# Patient Record
Sex: Male | Born: 1960 | Race: Black or African American | Hispanic: No | Marital: Single | Smoking: Former smoker
Health system: Southern US, Community
[De-identification: ages and names within clinical notes are randomized; demographics above are authoritative.]

## PROBLEM LIST (undated history)

## (undated) DIAGNOSIS — E669 Obesity, unspecified: Secondary | ICD-10-CM

## (undated) DIAGNOSIS — K648 Other hemorrhoids: Secondary | ICD-10-CM

---

## 2010-12-10 ENCOUNTER — Ambulatory Visit
Admission: RE | Admit: 2010-12-10 | Discharge: 2010-12-10 | Disposition: A | Discharge status: Home / Self Care | Payer: Self-pay | Source: Ambulatory Visit | Attending: Internal Medicine | Admitting: Internal Medicine

## 2010-12-10 ENCOUNTER — Other Ambulatory Visit: Payer: Self-pay | Admitting: Internal Medicine

## 2010-12-10 DIAGNOSIS — M549 Dorsalgia, unspecified: Secondary | ICD-10-CM

## 2010-12-13 ENCOUNTER — Emergency Department (HOSPITAL_COMMUNITY): Payer: Medicaid Other

## 2010-12-13 ENCOUNTER — Emergency Department (HOSPITAL_COMMUNITY)
Admission: EM | Admit: 2010-12-13 | Discharge: 2010-12-13 | Disposition: A | Discharge status: Home / Self Care | Payer: Medicaid Other | Attending: Emergency Medicine | Admitting: Emergency Medicine

## 2010-12-13 DIAGNOSIS — R0789 Other chest pain: Secondary | ICD-10-CM

## 2010-12-13 DIAGNOSIS — R072 Precordial pain: Secondary | ICD-10-CM | POA: Insufficient documentation

## 2010-12-13 MED ORDER — HYDROCODONE-ACETAMINOPHEN 7.5-325 MG/15ML PO SOLN: 15.0000 mL | Freq: Four times a day (QID) | ORAL | Status: AC | PRN

## 2010-12-13 MED ORDER — MELOXICAM 7.5 MG PO TABS: 7.5000 mg | ORAL_TABLET | Freq: Every day | ORAL | Status: DC

## 2010-12-13 MED ORDER — TRAMADOL HCL 50 MG PO TABS: 50.0000 mg | ORAL_TABLET | Freq: Four times a day (QID) | ORAL | Status: AC | PRN

## 2010-12-13 MED ORDER — TRAMADOL HCL 50 MG PO TABS
100.0000 mg | ORAL_TABLET | Freq: Once | ORAL | Status: AC
  Administered 2010-12-13: 100 mg via ORAL
  Filled 2010-12-13 (×2): qty 1

## 2010-12-13 MED ORDER — MELOXICAM 15 MG PO TABS
15.0000 mg | ORAL_TABLET | Freq: Once | ORAL | Status: AC
  Administered 2010-12-13: 15 mg via ORAL
  Filled 2010-12-13: qty 1
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
## 2010-12-18 NOTE — ED Provider Notes (Signed)
Medical screening examination/treatment/procedure(s) were performed by non-physician practitioner and as supervising physician I was immediately available for consultation/collaboration.   Suzi Roots, MD 12/18/10 [phone]

## 2011-09-19 ENCOUNTER — Emergency Department (HOSPITAL_COMMUNITY)
Admission: EM | Admit: 2011-09-19 | Discharge: 2011-09-19 | Disposition: A | Discharge status: Home / Self Care | Payer: Medicaid Other | Source: Home / Self Care | Attending: Emergency Medicine | Admitting: Emergency Medicine

## 2011-09-19 ENCOUNTER — Encounter (HOSPITAL_COMMUNITY): Payer: Self-pay | Admitting: *Deleted

## 2011-09-19 DIAGNOSIS — K625 Hemorrhage of anus and rectum: Secondary | ICD-10-CM

## 2011-09-19 HISTORY — DX: Other hemorrhoids: K64.8

## 2011-09-19 HISTORY — DX: Obesity, unspecified: E66.9

## 2011-09-19 NOTE — ED Provider Notes (Signed)
History     CSN: [id number]  Arrival date & time 09/19/11  1000   First MD Initiated Contact with Patient 09/19/11 1016      Chief Complaint  Patient presents with  . Rectal Bleeding    (Consider location/radiation/quality/duration/timing/severity/associated sxs/prior treatment) HPI Comments: Patient with a long history of intermittent rectal bleeding reports painless rectal bleeding which varies from bright red blood per rectum to noting blood on the toilet paper every time time he has a bowel movement over the past week, which is usually once a day or every 2 days. No melena. No chest pain, shortness of breath, weakness, presyncope, syncope. he had a normal colonoscopy 6 months ago. No particular aggravating or alleviating factors. Hasn't tried anything for his symptoms. No history of Crohn's, ulcerative colitis, IBS, chronic polyps, diverticulitis, abdominal surgery. States that his diabetes is diet controlled.  ROS as noted in HPI. All other ROS negative.   Patient is a 51 y.o. male presenting with hematochezia. The history is provided by the patient. No language interpreter was used.  Rectal Bleeding  The current episode started more than 1 week ago. The onset was sudden. The problem occurs frequently. The problem has been unchanged. The patient is experiencing no pain. The stool is described as soft. There was no prior successful therapy. There was no prior unsuccessful therapy. Associated symptoms include hemorrhoids. Pertinent negatives include no anorexia, no fever, no abdominal pain, no diarrhea, no hematemesis, no nausea, no rectal pain, no vomiting, no hematuria, no chest pain and no difficulty breathing. His past medical history does not include abdominal surgery, inflammatory bowel disease, recent abdominal injury, recent antibiotic use, recent change in diet or a recent illness.    Past Medical History  Diagnosis Date  . Diabetes mellitus   . Internal hemorrhoids   .  Obesity     History reviewed. No pertinent past surgical history.  History reviewed. No pertinent family history.  History  Substance Use Topics  . Smoking status: Former Games developer  . Smokeless tobacco: Not on file  . Alcohol Use: No      Review of Systems  Constitutional: Negative for fever.  Cardiovascular: Negative for chest pain.  Gastrointestinal: Positive for hematochezia and hemorrhoids. Negative for nausea, vomiting, abdominal pain, diarrhea, rectal pain, anorexia and hematemesis.  Genitourinary: Negative for hematuria.    Allergies  Ibuprofen  Home Medications  No current outpatient prescriptions on file.  BP 141/81  Pulse 79  Temp 98 F (36.7 C) (Oral)  Resp 16  SpO2 96%  Physical Exam  Nursing note and vitals reviewed. Constitutional: He is oriented to person, place, and time. He appears well-developed and well-nourished.  HENT:  Head: Normocephalic and atraumatic.  Eyes: Conjunctivae and EOM are normal.  Neck: Normal range of motion.  Cardiovascular: Normal rate.   Pulmonary/Chest: Effort normal. No respiratory distress.  Abdominal: Normal appearance and bowel sounds are normal. He exhibits no distension, no ascites and no mass. There is no tenderness. There is no rigidity, no rebound, no guarding and no CVA tenderness.       Obese  Genitourinary: Prostate normal. Rectal exam shows no external hemorrhoid, no internal hemorrhoid, no fissure, no mass, no tenderness and anal tone normal. Guaiac positive stool.       Erythematous, friable rectal mucosa above the dentate line. No active bleeding.  Musculoskeletal: Normal range of motion.  Neurological: He is alert and oriented to person, place, and time. Coordination normal.  Skin: Skin is warm and  dry.  Psychiatric: He has a normal mood and affect. His behavior is normal. Judgment and thought content normal.    ED Course  Procedures (including critical care time)   Labs Reviewed  OCCULT BLOOD, POC  DEVICE  OCCULT BLOOD X 1 CARD TO LAB, STOOL   No results found.   1. Rectal bleeding    Results for orders placed during the hospital encounter of 09/19/11  OCCULT BLOOD, POC DEVICE      Component Value Range   Fecal Occult Bld POSITIVE       MDM  No active bleeding noted on anoscopy. Vitals are acceptable, patient not reporting any symptoms of anemia. Abdomen is benign. Given normal colonoscopy 6 months ago, doubt malignancy. Will have him followup with his GI specialist or with Dr. Arlyce Dice, GI on call next week, as Crohn's and ulcerative colitis still in differential. Discussed signs and symptoms that should prompt has returned to the emergency department. Patient agrees with plan.  Luiz Blare, MD 09/19/11 [phone]

## 2011-09-19 NOTE — ED Notes (Signed)
Pt reports he had a colonoscopy in March of the  Year and he was told it was norman.  Pt states he has had bright red rectal bleeding intermittently the last week.

## 2013-01-15 IMAGING — CR DG THORACIC SPINE 3V
4 series · 4 of 4 positions shown · non-contrast
Comparison: None.

CLINICAL DATA: Back pain.

THORACIC SPINE - 2 VIEW + SWIMMERS

[view not recorded (1 of 4)]
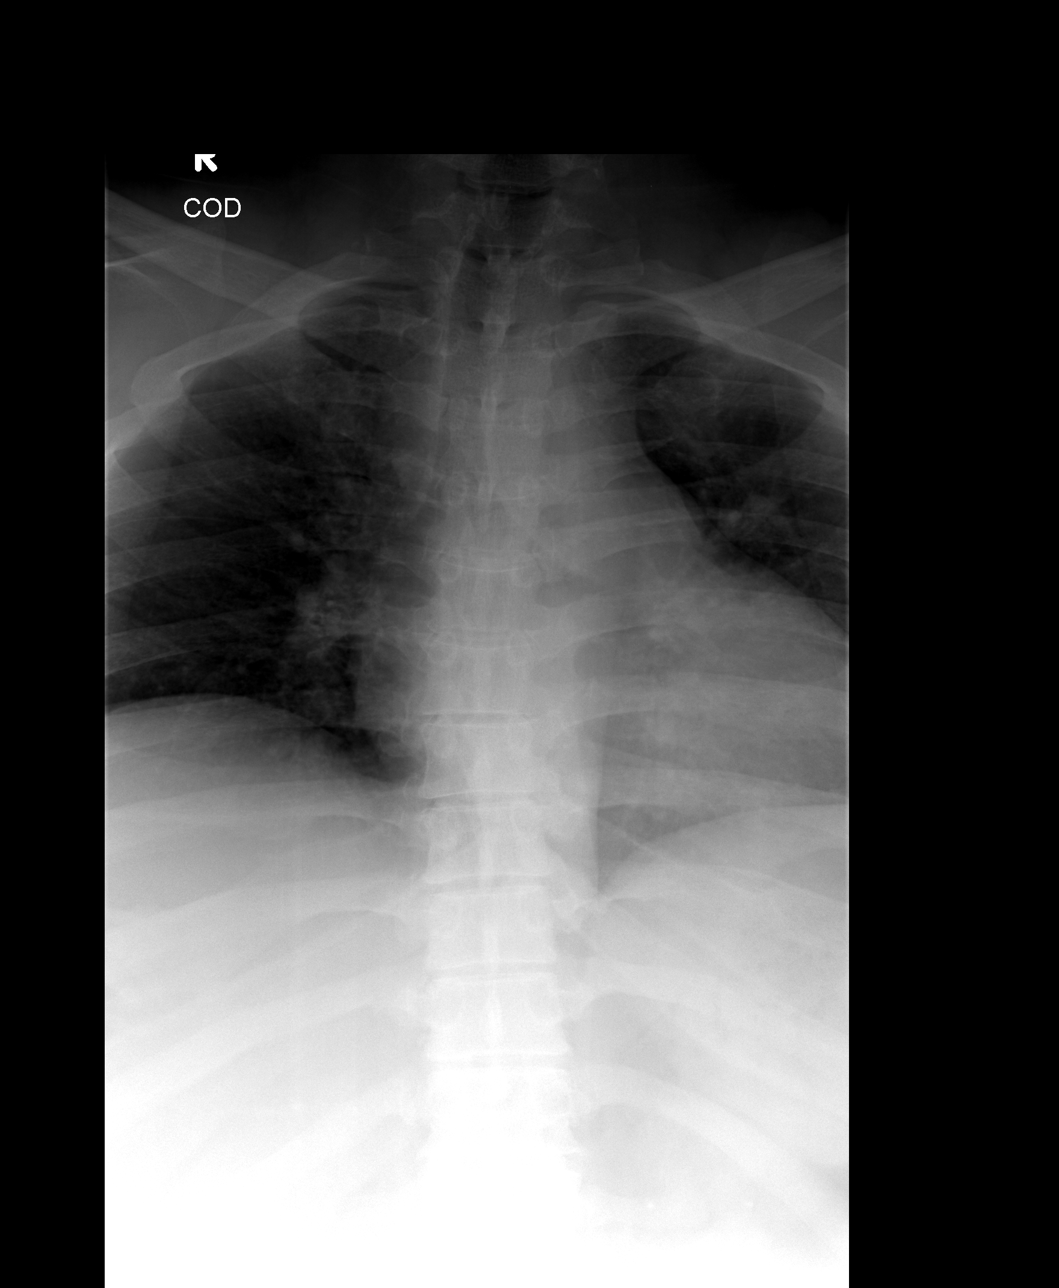

[view not recorded (2 of 4)]
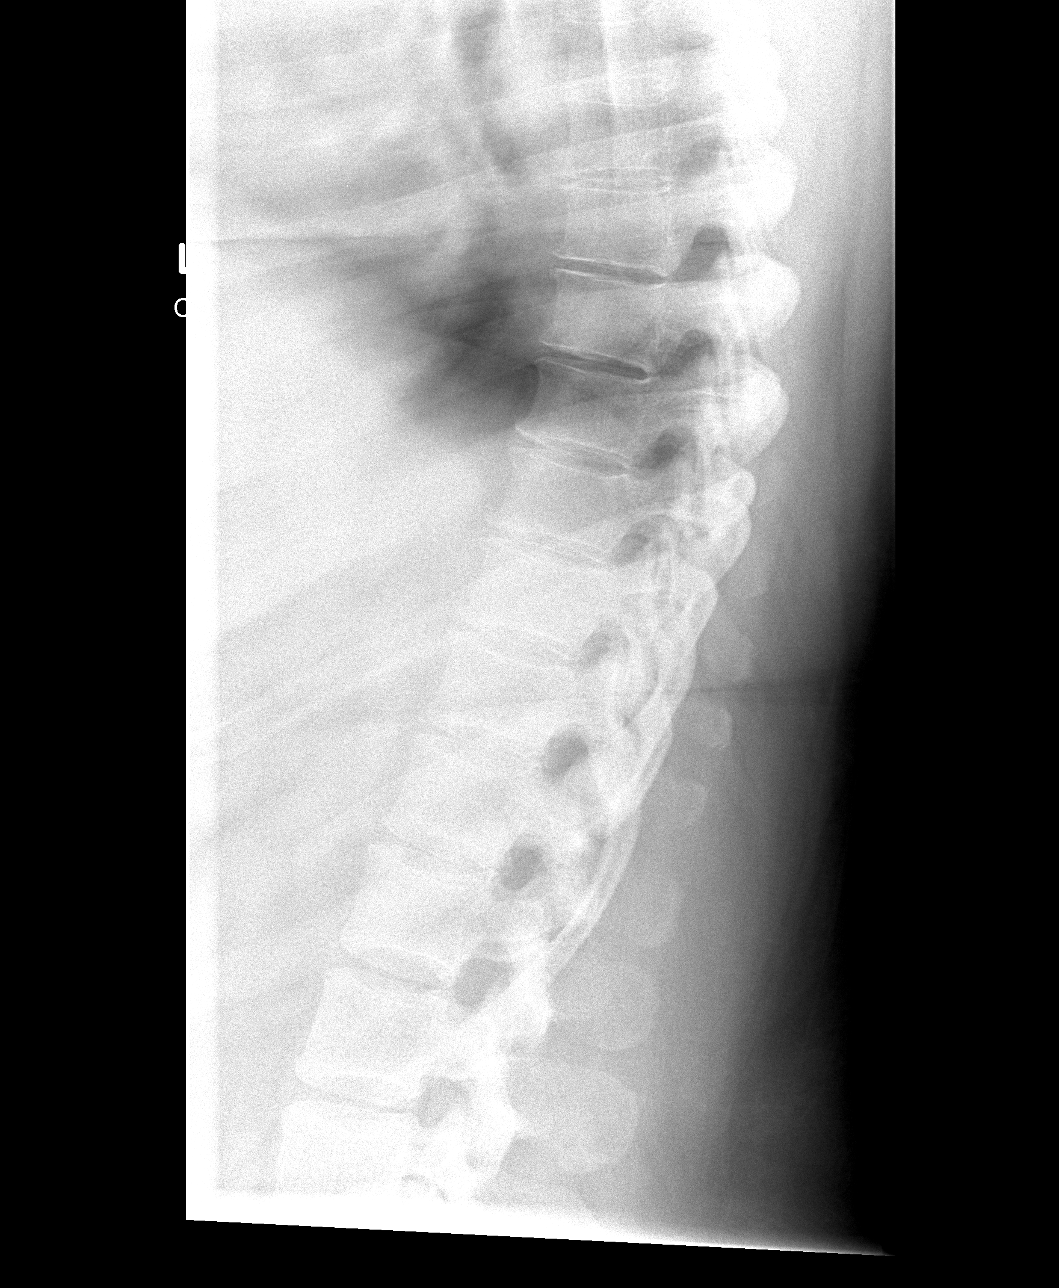

[view not recorded (3 of 4)]
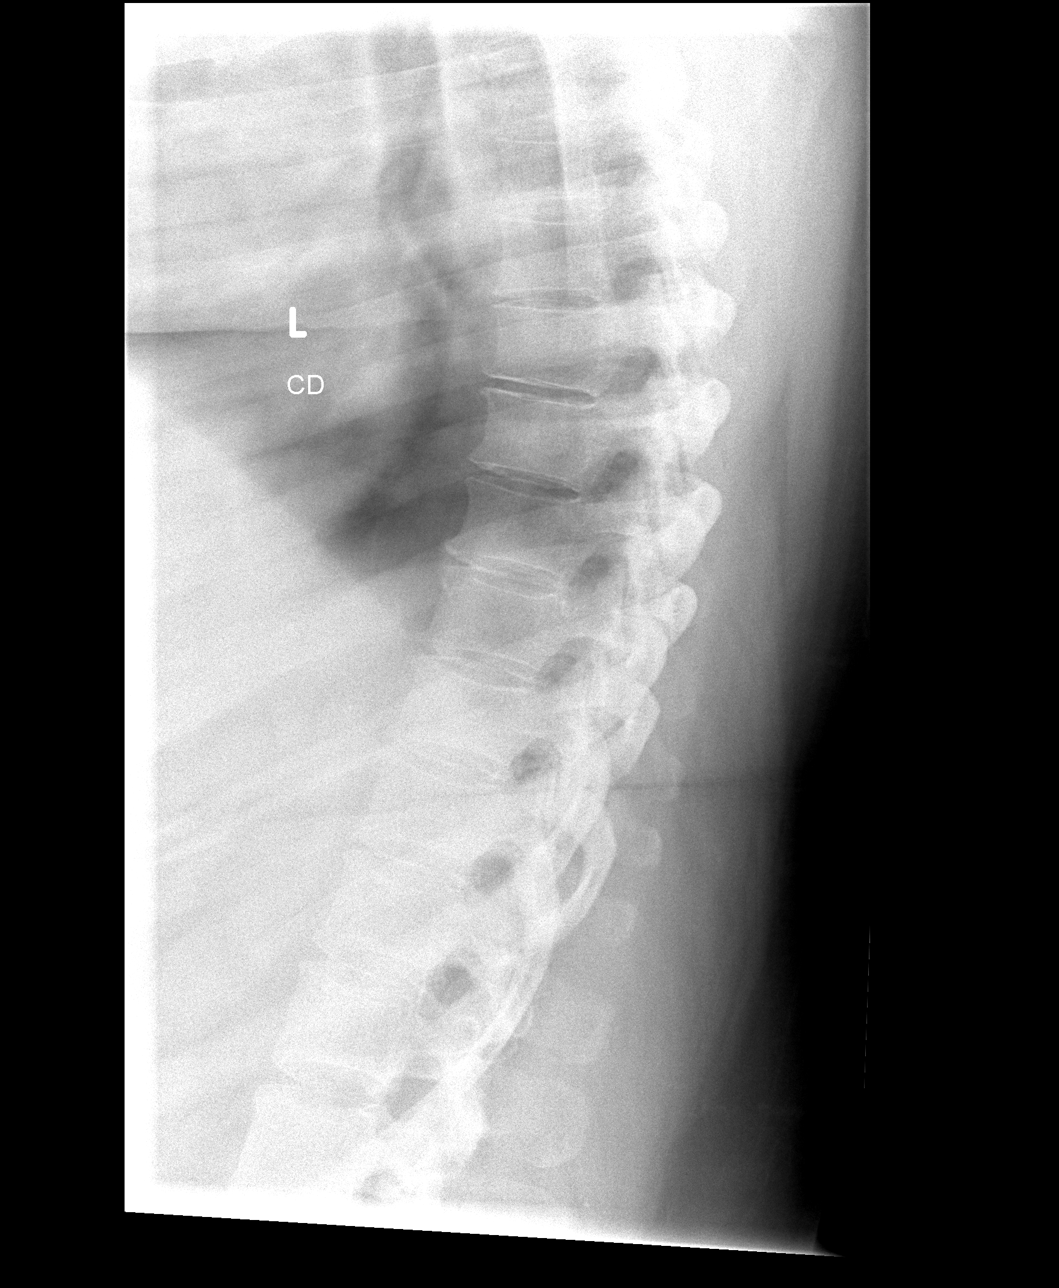

[view not recorded (4 of 4)]
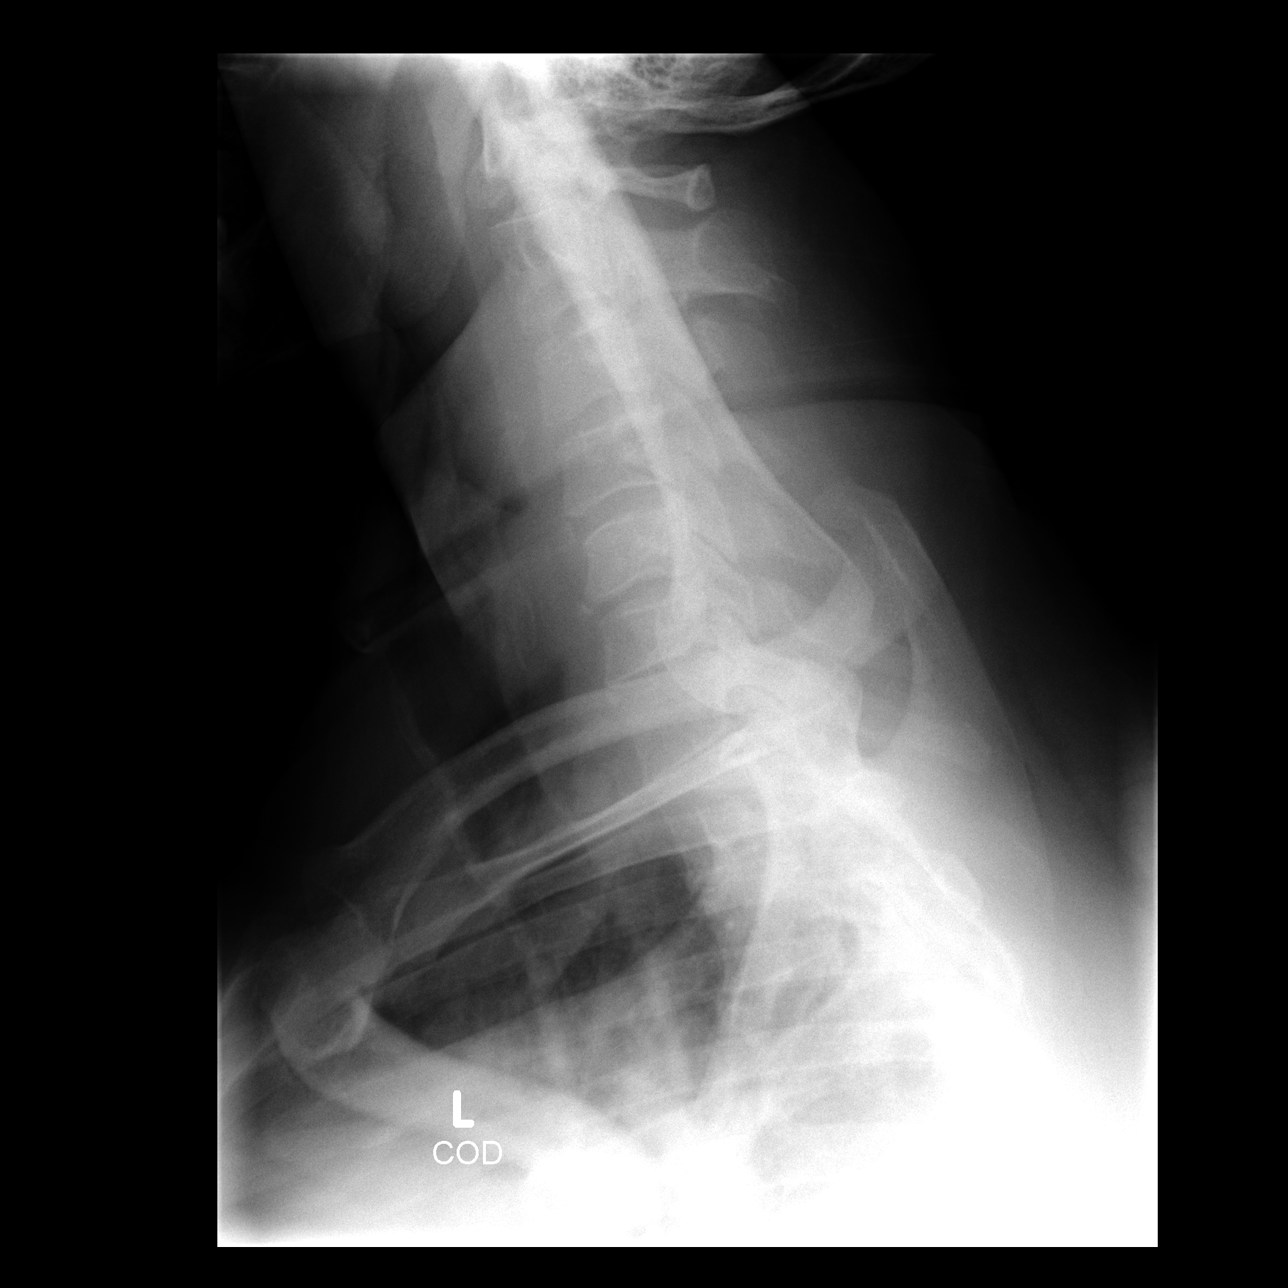

[4 of 4 positions shown; findings below may reference images not displayed]

FINDINGS: Alignment is anatomic, including at the cervicothoracic
junction.  Vertebral body height is maintained.  Mild endplate
degenerative changes are seen in the mid and lower thoracic spine.
IMPRESSION: Mild spondylosis.  No acute findings.
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
## 2013-08-28 ENCOUNTER — Non-Acute Institutional Stay (SKILLED_NURSING_FACILITY): Payer: PRIVATE HEALTH INSURANCE | Admitting: Internal Medicine

## 2013-08-28 DIAGNOSIS — E1165 Type 2 diabetes mellitus with hyperglycemia: Secondary | ICD-10-CM

## 2013-08-28 DIAGNOSIS — D62 Acute posthemorrhagic anemia: Secondary | ICD-10-CM

## 2013-08-28 DIAGNOSIS — S82141S Displaced bicondylar fracture of right tibia, sequela: Secondary | ICD-10-CM

## 2013-08-28 DIAGNOSIS — IMO0001 Reserved for inherently not codable concepts without codable children: Secondary | ICD-10-CM

## 2013-08-28 DIAGNOSIS — S8290XS Unspecified fracture of unspecified lower leg, sequela: Secondary | ICD-10-CM

## 2013-08-28 DIAGNOSIS — K5900 Constipation, unspecified: Secondary | ICD-10-CM

## 2013-08-29 DIAGNOSIS — S82141A Displaced bicondylar fracture of right tibia, initial encounter for closed fracture: Secondary | ICD-10-CM | POA: Insufficient documentation

## 2013-08-29 DIAGNOSIS — IMO0001 Reserved for inherently not codable concepts without codable children: Secondary | ICD-10-CM | POA: Insufficient documentation

## 2013-08-29 DIAGNOSIS — K5900 Constipation, unspecified: Secondary | ICD-10-CM | POA: Insufficient documentation

## 2013-08-29 DIAGNOSIS — D62 Acute posthemorrhagic anemia: Secondary | ICD-10-CM | POA: Insufficient documentation

## 2013-08-29 DIAGNOSIS — E1165 Type 2 diabetes mellitus with hyperglycemia: Secondary | ICD-10-CM
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
## 2013-09-02 ENCOUNTER — Non-Acute Institutional Stay (SKILLED_NURSING_FACILITY): Payer: PRIVATE HEALTH INSURANCE | Admitting: Internal Medicine

## 2013-09-02 DIAGNOSIS — D62 Acute posthemorrhagic anemia: Secondary | ICD-10-CM

## 2013-09-03 NOTE — Progress Notes (Signed)
Patient ID: DEX BLAKELY, male   DOB: 1960/10/02, 53 y.o.   MRN: [id number]           PROGRESS NOTE  DATE: 09/02/2013    FACILITY:  [facility]  LEVEL OF CARE: SNF (31)  Acute Visit  CHIEF COMPLAINT:  Manage acute blood loss anemia.    HISTORY OF PRESENT ILLNESS: I was requested by the staff to assess the patient regarding above problem(s):  ANEMIA:   On 08/30/2013:  The patient's hemoglobin was 12.2, MCV 83.  On 08/13/2013:  Hemoglobin was 14.5.  The patient is status post surgery for his right tibia and fibula fracture.    PAST MEDICAL HISTORY : Reviewed.  No changes/see problem list  CURRENT MEDICATIONS: Reviewed per MAR/see medication list  PHYSICAL EXAMINATION  VS: see VS section  GENERAL: no acute distress, moderately obese body habitus RESPIRATORY: breathing is even & unlabored, BS CTAB CARDIAC: RRR, no murmur, no extra heart sounds, right lower extremity has +2 edema       ASSESSMENT/PLAN:  Acute blood loss anemia.  New problem.  We will monitor.    CPT CODE: 82956          Angela Cox, MD [facility] [phone]

## 2013-09-06 ENCOUNTER — Other Ambulatory Visit: Payer: Self-pay | Admitting: *Deleted

## 2013-09-06 ENCOUNTER — Non-Acute Institutional Stay (SKILLED_NURSING_FACILITY): Payer: PRIVATE HEALTH INSURANCE | Admitting: Internal Medicine

## 2013-09-06 DIAGNOSIS — G5620 Lesion of ulnar nerve, unspecified upper limb: Secondary | ICD-10-CM

## 2013-09-06 DIAGNOSIS — G5730 Lesion of lateral popliteal nerve, unspecified lower limb: Secondary | ICD-10-CM

## 2013-09-06 DIAGNOSIS — G5731 Lesion of lateral popliteal nerve, right lower limb: Secondary | ICD-10-CM

## 2013-09-06 DIAGNOSIS — G5623 Lesion of ulnar nerve, bilateral upper limbs: Secondary | ICD-10-CM

## 2013-09-06 MED ORDER — OXYCODONE HCL 15 MG PO TABS: ORAL_TABLET | ORAL | Status: DC

## 2013-09-06 NOTE — Telephone Encounter (Signed)
[facility] 
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
## 2013-09-13 ENCOUNTER — Non-Acute Institutional Stay (SKILLED_NURSING_FACILITY): Payer: PRIVATE HEALTH INSURANCE | Admitting: Internal Medicine

## 2013-09-13 DIAGNOSIS — M25569 Pain in unspecified knee: Secondary | ICD-10-CM

## 2013-09-13 DIAGNOSIS — M25561 Pain in right knee: Secondary | ICD-10-CM

## 2013-09-18 ENCOUNTER — Non-Acute Institutional Stay (SKILLED_NURSING_FACILITY): Payer: PRIVATE HEALTH INSURANCE | Admitting: Internal Medicine

## 2013-09-18 DIAGNOSIS — K5900 Constipation, unspecified: Secondary | ICD-10-CM

## 2013-09-18 DIAGNOSIS — S82141S Displaced bicondylar fracture of right tibia, sequela: Secondary | ICD-10-CM

## 2013-09-18 DIAGNOSIS — M62838 Other muscle spasm: Secondary | ICD-10-CM

## 2013-09-18 DIAGNOSIS — S8290XS Unspecified fracture of unspecified lower leg, sequela: Secondary | ICD-10-CM

## 2013-09-18 DIAGNOSIS — E119 Type 2 diabetes mellitus without complications: Secondary | ICD-10-CM

## 2013-09-20 DIAGNOSIS — E119 Type 2 diabetes mellitus without complications: Secondary | ICD-10-CM | POA: Insufficient documentation

## 2013-09-20 NOTE — Progress Notes (Signed)
         PROGRESS NOTE  DATE: 09/18/2013  FACILITY: Nursing Home Location: [facility]  LEVEL OF CARE: SNF (31)  Routine Visit  CHIEF COMPLAINT:  Manage right tibial plateau fracture, diabetes mellitus & constipation  HISTORY OF PRESENT ILLNESS:  REASSESSMENT OF ONGOING PROBLEM(S):  RIGHT TIBIAL PLATEAU FX: Patient is status post repair and is undergoing rehabilitation. He denies pain. He is tolerating his pain medications without any side effects. He also denies any problems with rehabilitation. He is complaining of intermittent muscle spasms.  CONSTIPATION: The constipation remains stable. No complications from the medications presently being used. Patient denies ongoing constipation, abdominal pain, nausea or vomiting.  DM:pt's DM remains stable.  Pt denies polyuria, polydipsia, polyphagia, changes in vision or hypoglycemic episodes.  No complications noted from the medication presently being used.  Last hemoglobin A1c is: Not available.  PAST MEDICAL HISTORY : Reviewed.  No changes/see problem list  CURRENT MEDICATIONS: Reviewed per MAR/see medication list  REVIEW OF SYSTEMS:  GENERAL: no change in appetite, no fatigue, no weight changes, no fever, chills or weakness, complains of dry skin RESPIRATORY: no cough, SOB, DOE, wheezing, hemoptysis CARDIAC: no chest pain,  or palpitations, complains of lower extremity swelling GI: no abdominal pain, diarrhea, constipation, heart burn, nausea or vomiting  PHYSICAL EXAMINATION  VS:  See VS section  GENERAL: no acute distress, moderately obese body habitus EYES: conjunctivae normal, sclerae normal, normal eye lids NECK: supple, trachea midline, no neck masses, no thyroid tenderness, no thyromegaly LYMPHATICS: no LAN in the neck, no supraclavicular LAN RESPIRATORY: breathing is even & unlabored, BS CTAB CARDIAC: RRR, no murmur,no extra heart sounds, +2 bilateral lower extremity edema GI: abdomen soft,  normal BS, no masses, no tenderness, no hepatomegaly, no splenomegaly PSYCHIATRIC: the patient is alert & oriented to person, affect & behavior appropriate  LABS/RADIOLOGY:  8-15 hemoglobin 12.2, MCV 83, platelets 632, WBC 3.9, BMP normal  ASSESSMENT/PLAN:  Right tibial plateau fracture-status post repair. Continue rehabilitation. Diabetes mellitus-check hemoglobin A1c Constipation-well-controlled Muscle spasms-start Flexeril 5 mg 3 times a day when necessary Dry skin-start cetaphil cream daily Check liver profile  CPT CODE: 16109  Angela Cox, MD [facility] [phone]

## 2013-09-23 ENCOUNTER — Other Ambulatory Visit: Payer: Self-pay | Admitting: *Deleted

## 2013-09-23 ENCOUNTER — Non-Acute Institutional Stay (SKILLED_NURSING_FACILITY): Payer: PRIVATE HEALTH INSURANCE | Admitting: Internal Medicine

## 2013-09-23 DIAGNOSIS — M79604 Pain in right leg: Secondary | ICD-10-CM

## 2013-09-23 DIAGNOSIS — M79609 Pain in unspecified limb: Secondary | ICD-10-CM

## 2013-09-23 DIAGNOSIS — R609 Edema, unspecified: Secondary | ICD-10-CM

## 2013-09-23 DIAGNOSIS — R030 Elevated blood-pressure reading, without diagnosis of hypertension: Secondary | ICD-10-CM

## 2013-09-23 MED ORDER — OXYCODONE HCL ER 10 MG PO T12A: EXTENDED_RELEASE_TABLET | ORAL | Status: DC
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
## 2013-09-23 NOTE — Telephone Encounter (Signed)
[facility] 

## 2013-09-27 NOTE — Progress Notes (Signed)
Patient ID: James Parker, male   DOB: 1960/01/15, 53 y.o.   MRN: [id number]           PROGRESS NOTE  DATE: 09/23/2013           FACILITY:  [facility]  LEVEL OF CARE: SNF (31)  Acute Visit  CHIEF COMPLAINT:  Manage right lower extremity swelling and pain.    HISTORY OF PRESENT ILLNESS: I was requested by the staff to assess the patient regarding above problem(s):  Patient is complaining of increased right lower extremity swelling and pain for a couple of days.   He denies chest pains or shortness of breath.  Patient is status post surgical repair of right tibial plateau fracture and is currently receiving rehabilitation.    PAST MEDICAL HISTORY : Reviewed.  No changes/see problem list  CURRENT MEDICATIONS: Reviewed per MAR/see medication list  REVIEW OF SYSTEMS:  GENERAL: no change in appetite, no fatigue, no weight changes, no fever, chills or weakness RESPIRATORY: no cough, SOB, DOE,, wheezing, hemoptysis CARDIAC: no chest pain or palpitations; increased right lower extremity swelling       GI: no abdominal pain, diarrhea, constipation, heart burn, nausea or vomiting  PHYSICAL EXAMINATION  VS: see VS section  GENERAL: no acute distress, morbidly obese body habitus EYES: conjunctivae normal, sclerae normal, normal eye lids NECK: supple, trachea midline, no neck masses, no thyroid tenderness, no thyromegaly LYMPHATICS: no LAN in the neck, no supraclavicular LAN RESPIRATORY: breathing is even & unlabored, BS CTAB CARDIAC: RRR, no murmur,no extra heart sounds, right lower extremity has +4 edema, left lower extremity has +3 edema        GI: abdomen soft, normal BS, no masses, no tenderness, no hepatomegaly, no splenomegaly PSYCHIATRIC: the patient is alert & oriented to person, affect & behavior appropriate  ASSESSMENT/PLAN:  Right lower extremity edema and pain.  Uncontrolled problem.  Obtain venous doppler ultrasound.  Start OxyContin 10 mg b.i.d.      Elevated blood pressure.  Likely secondary to pain.  Monitor.    CPT CODE: 40981          Angela Cox, MD [facility] [phone]

## 2013-10-18 ENCOUNTER — Non-Acute Institutional Stay (SKILLED_NURSING_FACILITY): Payer: PRIVATE HEALTH INSURANCE | Admitting: Internal Medicine

## 2013-10-18 DIAGNOSIS — R609 Edema, unspecified: Secondary | ICD-10-CM

## 2013-10-18 DIAGNOSIS — M79604 Pain in right leg: Secondary | ICD-10-CM

## 2013-10-25 ENCOUNTER — Non-Acute Institutional Stay (SKILLED_NURSING_FACILITY): Payer: PRIVATE HEALTH INSURANCE | Admitting: Internal Medicine

## 2013-10-25 DIAGNOSIS — L02419 Cutaneous abscess of limb, unspecified: Secondary | ICD-10-CM

## 2013-10-25 DIAGNOSIS — L03119 Cellulitis of unspecified part of limb: Secondary | ICD-10-CM

## 2013-10-25 DIAGNOSIS — M79604 Pain in right leg: Secondary | ICD-10-CM
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
## 2014-05-21 ENCOUNTER — Ambulatory Visit: Payer: Worker's Compensation

## 2014-05-21 ENCOUNTER — Ambulatory Visit (INDEPENDENT_AMBULATORY_CARE_PROVIDER_SITE_OTHER): Payer: Worker's Compensation | Admitting: Family Medicine

## 2014-05-21 VITALS — BP 142/70 | HR 67 | Temp 97.6°F | Resp 20 | Ht 68.0 in | Wt 283.0 lb

## 2014-05-21 DIAGNOSIS — M79644 Pain in right finger(s): Secondary | ICD-10-CM | POA: Diagnosis not present

## 2014-05-21 DIAGNOSIS — L03011 Cellulitis of right finger: Secondary | ICD-10-CM

## 2014-05-21 MED ORDER — HYDROCODONE-ACETAMINOPHEN 5-325 MG PO TABS
1.0000 | ORAL_TABLET | Freq: Four times a day (QID) | ORAL | Status: DC | PRN
Start: 2014-05-21 — End: 2018-01-29

## 2014-05-21 MED ORDER — DOXYCYCLINE HYCLATE 100 MG PO CAPS: 100.0000 mg | ORAL_CAPSULE | Freq: Two times a day (BID) | ORAL | Status: DC
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
## 2014-05-21 NOTE — Progress Notes (Signed)
Verbal Consent Obtained. Digital block anesthesia with 8 cc of 1% lidocaine plain.  11 blade used to incise the lesion centrally.  Purulence and clotted blood expressed. Cleansed and dressed.

## 2014-05-21 NOTE — Patient Instructions (Addendum)
You had an infection and clotted blood that we opened up and got out.  Please take the antibiotic twice daily for 10 days.  Please keep the area clean.  Take the pain meds as needed.    Paronychia Paronychia is an inflammatory reaction involving the folds of the skin surrounding the fingernail. This is commonly caused by an infection in the skin around a nail. The most common cause of paronychia is frequent wetting of the hands (as seen with bartenders, food servers, nurses or others who wet their hands). This makes the skin around the fingernail susceptible to infection by bacteria (germs) or fungus. Other predisposing factors are:  Aggressive manicuring.  Nail biting.  Thumb sucking. The most common cause is a staphylococcal (a type of germ) infection, or a fungal (Candida) infection. When caused by a germ, it usually comes on suddenly with redness, swelling, pus and is often painful. It may get under the nail and form an abscess (collection of pus), or form an abscess around the nail. If the nail itself is infected with a fungus, the treatment is usually prolonged and may require oral medicine for up to one year. Your caregiver will determine the length of time treatment is required. The paronychia caused by bacteria (germs) may largely be avoided by not pulling on hangnails or picking at cuticles. When the infection occurs at the tips of the finger it is called felon. When the cause of paronychia is from the herpes simplex virus (HSV) it is called herpetic whitlow. TREATMENT  When an abscess is present treatment is often incision and drainage. This means that the abscess must be cut open so the pus can get out. When this is done, the following home care instructions should be followed. HOME CARE INSTRUCTIONS   It is important to keep the affected fingers very dry. Rubber or plastic gloves over cotton gloves should be used whenever the hand must be placed in water.  Keep wound clean, dry and  dressed as suggested by your caregiver between warm soaks or warm compresses.  Soak in warm water for fifteen to twenty minutes three to four times per day for bacterial infections. Fungal infections are very difficult to treat, so often require treatment for long periods of time.  For bacterial (germ) infections take antibiotics (medicine which kill germs) as directed and finish the prescription, even if the problem appears to be solved before the medicine is gone.  Only take over-the-counter or prescription medicines for pain, discomfort, or fever as directed by your caregiver. SEEK IMMEDIATE MEDICAL CARE IF:  You have redness, swelling, or increasing pain in the wound.  You notice pus coming from the wound.  You have a fever.  You notice a bad smell coming from the wound or dressing. Document Released: 06/22/2000 Document Revised: 03/21/2011 Document Reviewed: 02/22/2008 [facility] Patient Information 2015 [employer] [place]. This information is not intended to replace advice given to you by your health care provider. Make sure you discuss any questions you have with your health care provider.

## 2014-05-24 LAB — WOUND CULTURE: GRAM STAIN: NONE SEEN

## 2014-07-09 ENCOUNTER — Other Ambulatory Visit (HOSPITAL_COMMUNITY): Payer: Self-pay | Admitting: Chiropractic Medicine

## 2014-07-09 ENCOUNTER — Ambulatory Visit (HOSPITAL_COMMUNITY)
Admission: RE | Admit: 2014-07-09 | Discharge: 2014-07-09 | Disposition: A | Discharge status: Home / Self Care | Payer: No Typology Code available for payment source | Source: Ambulatory Visit | Attending: Chiropractic Medicine | Admitting: Chiropractic Medicine

## 2014-07-09 DIAGNOSIS — M542 Cervicalgia: Secondary | ICD-10-CM | POA: Insufficient documentation

## 2015-02-27 ENCOUNTER — Emergency Department (HOSPITAL_COMMUNITY)
Admission: EM | Admit: 2015-02-27 | Discharge: 2015-02-27 | Disposition: A | Discharge status: Home / Self Care | Payer: Non-veteran care | Attending: Emergency Medicine | Admitting: Emergency Medicine

## 2015-02-27 ENCOUNTER — Encounter (HOSPITAL_COMMUNITY): Payer: Self-pay | Admitting: Emergency Medicine

## 2015-02-27 DIAGNOSIS — M79652 Pain in left thigh: Secondary | ICD-10-CM | POA: Insufficient documentation

## 2015-02-27 DIAGNOSIS — Z79899 Other long term (current) drug therapy: Secondary | ICD-10-CM | POA: Diagnosis not present

## 2015-02-27 DIAGNOSIS — Z87891 Personal history of nicotine dependence: Secondary | ICD-10-CM | POA: Diagnosis not present

## 2015-02-27 DIAGNOSIS — M533 Sacrococcygeal disorders, not elsewhere classified: Secondary | ICD-10-CM | POA: Diagnosis not present

## 2015-02-27 DIAGNOSIS — E669 Obesity, unspecified: Secondary | ICD-10-CM | POA: Insufficient documentation

## 2015-02-27 DIAGNOSIS — M5432 Sciatica, left side: Secondary | ICD-10-CM | POA: Diagnosis not present

## 2015-02-27 DIAGNOSIS — Z8719 Personal history of other diseases of the digestive system: Secondary | ICD-10-CM | POA: Diagnosis not present

## 2015-02-27 DIAGNOSIS — E119 Type 2 diabetes mellitus without complications: Secondary | ICD-10-CM | POA: Diagnosis not present

## 2015-02-27 DIAGNOSIS — M79605 Pain in left leg: Secondary | ICD-10-CM | POA: Diagnosis present

## 2015-02-27 MED ORDER — PREDNISONE 50 MG PO TABS: ORAL_TABLET | ORAL | Status: DC

## 2015-02-27 MED ORDER — KETOROLAC TROMETHAMINE 60 MG/2ML IM SOLN
60.0000 mg | Freq: Once | INTRAMUSCULAR | Status: AC
  Administered 2015-02-27: 60 mg via INTRAMUSCULAR
  Filled 2015-02-27: qty 2

## 2015-02-27 MED ORDER — ACETAMINOPHEN 325 MG PO TABS: 650.0000 mg | ORAL_TABLET | Freq: Once | ORAL | Status: DC

## 2015-02-27 NOTE — Discharge Instructions (Signed)
Schedule a follow-up appointment with your primary care provider for a visit in 1 week.   Sciatica With Rehab The sciatic nerve runs from the back down the leg and is responsible for sensation and control of the muscles in the back (posterior) side of the thigh, lower leg, and foot. Sciatica is a condition that is characterized by inflammation of this nerve.  SYMPTOMS   Signs of nerve damage, including numbness and/or weakness along the posterior side of the lower extremity.  Pain in the back of the thigh that may also travel down the leg.  Pain that worsens when sitting for long periods of time.  Occasionally, pain in the back or buttock. CAUSES  Inflammation of the sciatic nerve is the cause of sciatica. The inflammation is due to something irritating the nerve. Common sources of irritation include:  Sitting for long periods of time.  Direct trauma to the nerve.  Arthritis of the spine.  Herniated or ruptured disk.  Slipping of the vertebrae (spondylolisthesis).  Pressure from soft tissues, such as muscles or ligament-like tissue (fascia). RISK INCREASES WITH:  Sports that place pressure or stress on the spine (football or weightlifting).  Poor strength and flexibility.  Failure to warm up properly before activity.  Family history of low back pain or disk disorders.  Previous back injury or surgery.  Poor body mechanics, especially when lifting, or poor posture. PREVENTION   Warm up and stretch properly before activity.  Maintain physical fitness:  Strength, flexibility, and endurance.  Cardiovascular fitness.  Learn and use proper technique, especially with posture and lifting. When possible, have coach correct improper technique.  Avoid activities that place stress on the spine. PROGNOSIS If treated properly, then sciatica usually resolves within 6 weeks. However, occasionally surgery is necessary.  RELATED COMPLICATIONS   Permanent nerve damage,  including pain, numbness, tingle, or weakness.  Chronic back pain.  Risks of surgery: infection, bleeding, nerve damage, or damage to surrounding tissues. Use over-the-counter pain relievers such as Tylenol and Motrin. Ice or heat to the area may be beneficial as well.  TREATMENT Treatment initially involves resting from any activities that aggravate your symptoms. The use of ice and medication may help reduce pain and inflammation. The use of strengthening and stretching exercises may help reduce pain with activity. These exercises may be performed at home or with referral to a therapist. A therapist may recommend further treatments, such as transcutaneous electronic nerve stimulation (TENS) or ultrasound. Your caregiver may recommend corticosteroid injections to help reduce inflammation of the sciatic nerve. If symptoms persist despite non-surgical (conservative) treatment, then surgery may be recommended. MEDICATION  If pain medication is necessary, then nonsteroidal anti-inflammatory medications, such as aspirin and ibuprofen, or other minor pain relievers, such as acetaminophen, are often recommended.  Do not take pain medication for 7 days before surgery.  Prescription pain relievers may be given if deemed necessary by your caregiver. Use only as directed and only as much as you need.  Ointments applied to the skin may be helpful.  Corticosteroid injections may be given by your caregiver. These injections should be reserved for the most serious cases, because they may only be given a certain number of times. HEAT AND COLD  Cold treatment (icing) relieves pain and reduces inflammation. Cold treatment should be applied for 10 to 15 minutes every 2 to 3 hours for inflammation and pain and immediately after any activity that aggravates your symptoms. Use ice packs or massage the area with a piece  of ice (ice massage).  Heat treatment may be used prior to performing the stretching and  strengthening activities prescribed by your caregiver, physical therapist, or athletic trainer. Use a heat pack or soak the injury in warm water. SEEK MEDICAL CARE IF:  Treatment seems to offer no benefit, or the condition worsens.  Any medications produce adverse side effects. EXERCISES  RANGE OF MOTION (ROM) AND STRETCHING EXERCISES - Sciatica Most people with sciatic will find that their symptoms worsen with either excessive bending forward (flexion) or arching at the low back (extension). The exercises which will help resolve your symptoms will focus on the opposite motion. Your physician, physical therapist or athletic trainer will help you determine which exercises will be most helpful to resolve your low back pain. Do not complete any exercises without first consulting with your clinician. Discontinue any exercises which worsen your symptoms until you speak to your clinician. If you have pain, numbness or tingling which travels down into your buttocks, leg or foot, the goal of the therapy is for these symptoms to move closer to your back and eventually resolve. Occasionally, these leg symptoms will get better, but your low back pain may worsen; this is typically an indication of progress in your rehabilitation. Be certain to be very alert to any changes in your symptoms and the activities in which you participated in the 24 hours prior to the change. Sharing this information with your clinician will allow him/her to most efficiently treat your condition. These exercises may help you when beginning to rehabilitate your injury. Your symptoms may resolve with or without further involvement from your physician, physical therapist or athletic trainer. While completing these exercises, remember:   Restoring tissue flexibility helps normal motion to return to the joints. This allows healthier, less painful movement and activity.  An effective stretch should be held for at least 30 seconds.  A stretch  should never be painful. You should only feel a gentle lengthening or release in the stretched tissue. FLEXION RANGE OF MOTION AND STRETCHING EXERCISES: STRETCH - Flexion, Single Knee to Chest   Lie on a firm bed or floor with both legs extended in front of you.  Keeping one leg in contact with the floor, bring your opposite knee to your chest. Hold your leg in place by either grabbing behind your thigh or at your knee.  Pull until you feel a gentle stretch in your low back. Hold __________ seconds.  Slowly release your grasp and repeat the exercise with the opposite side. Repeat __________ times. Complete this exercise __________ times per day.  STRETCH - Flexion, Double Knee to Chest  Lie on a firm bed or floor with both legs extended in front of you.  Keeping one leg in contact with the floor, bring your opposite knee to your chest.  Tense your stomach muscles to support your back and then lift your other knee to your chest. Hold your legs in place by either grabbing behind your thighs or at your knees.  Pull both knees toward your chest until you feel a gentle stretch in your low back. Hold __________ seconds.  Tense your stomach muscles and slowly return one leg at a time to the floor. Repeat __________ times. Complete this exercise __________ times per day.  STRETCH - Low Trunk Rotation   Lie on a firm bed or floor. Keeping your legs in front of you, bend your knees so they are both pointed toward the ceiling and your feet are flat  on the floor.  Extend your arms out to the side. This will stabilize your upper body by keeping your shoulders in contact with the floor.  Gently and slowly drop both knees together to one side until you feel a gentle stretch in your low back. Hold for __________ seconds.  Tense your stomach muscles to support your low back as you bring your knees back to the starting position. Repeat the exercise to the other side. Repeat __________ times. Complete  this exercise __________ times per day  EXTENSION RANGE OF MOTION AND FLEXIBILITY EXERCISES: STRETCH - Extension, Prone on Elbows  Lie on your stomach on the floor, a bed will be too soft. Place your palms about shoulder width apart and at the height of your head.  Place your elbows under your shoulders. If this is too painful, stack pillows under your chest.  Allow your body to relax so that your hips drop lower and make contact more completely with the floor.  Hold this position for __________ seconds.  Slowly return to lying flat on the floor. Repeat __________ times. Complete this exercise __________ times per day.  RANGE OF MOTION - Extension, Prone Press Ups  Lie on your stomach on the floor, a bed will be too soft. Place your palms about shoulder width apart and at the height of your head.  Keeping your back as relaxed as possible, slowly straighten your elbows while keeping your hips on the floor. You may adjust the placement of your hands to maximize your comfort. As you gain motion, your hands will come more underneath your shoulders.  Hold this position __________ seconds.  Slowly return to lying flat on the floor. Repeat __________ times. Complete this exercise __________ times per day.  STRENGTHENING EXERCISES - Sciatica  These exercises may help you when beginning to rehabilitate your injury. These exercises should be done near your "sweet spot." This is the neutral, low-back arch, somewhere between fully rounded and fully arched, that is your least painful position. When performed in this safe range of motion, these exercises can be used for people who have either a flexion or extension based injury. These exercises may resolve your symptoms with or without further involvement from your physician, physical therapist or athletic trainer. While completing these exercises, remember:   Muscles can gain both the endurance and the strength needed for everyday activities through  controlled exercises.  Complete these exercises as instructed by your physician, physical therapist or athletic trainer. Progress with the resistance and repetition exercises only as your caregiver advises.  You may experience muscle soreness or fatigue, but the pain or discomfort you are trying to eliminate should never worsen during these exercises. If this pain does worsen, stop and make certain you are following the directions exactly. If the pain is still present after adjustments, discontinue the exercise until you can discuss the trouble with your clinician. STRENGTHENING - Deep Abdominals, Pelvic Tilt   Lie on a firm bed or floor. Keeping your legs in front of you, bend your knees so they are both pointed toward the ceiling and your feet are flat on the floor.  Tense your lower abdominal muscles to press your low back into the floor. This motion will rotate your pelvis so that your tail bone is scooping upwards rather than pointing at your feet or into the floor.  With a gentle tension and even breathing, hold this position for __________ seconds. Repeat __________ times. Complete this exercise __________ times per day.  STRENGTHENING - Abdominals, Crunches   Lie on a firm bed or floor. Keeping your legs in front of you, bend your knees so they are both pointed toward the ceiling and your feet are flat on the floor. Cross your arms over your chest.  Slightly tip your chin down without bending your neck.  Tense your abdominals and slowly lift your trunk high enough to just clear your shoulder blades. Lifting higher can put excessive stress on the low back and does not further strengthen your abdominal muscles.  Control your return to the starting position. Repeat __________ times. Complete this exercise __________ times per day.  STRENGTHENING - Quadruped, Opposite UE/LE Lift  Assume a hands and knees position on a firm surface. Keep your hands under your shoulders and your knees  under your hips. You may place padding under your knees for comfort.  Find your neutral spine and gently tense your abdominal muscles so that you can maintain this position. Your shoulders and hips should form a rectangle that is parallel with the floor and is not twisted.  Keeping your trunk steady, lift your right hand no higher than your shoulder and then your left leg no higher than your hip. Make sure you are not holding your breath. Hold this position __________ seconds.  Continuing to keep your abdominal muscles tense and your back steady, slowly return to your starting position. Repeat with the opposite arm and leg. Repeat __________ times. Complete this exercise __________ times per day.  STRENGTHENING - Abdominals and Quadriceps, Straight Leg Raise   Lie on a firm bed or floor with both legs extended in front of you.  Keeping one leg in contact with the floor, bend the other knee so that your foot can rest flat on the floor.  Find your neutral spine, and tense your abdominal muscles to maintain your spinal position throughout the exercise.  Slowly lift your straight leg off the floor about 6 inches for a count of 15, making sure to not hold your breath.  Still keeping your neutral spine, slowly lower your leg all the way to the floor. Repeat this exercise with each leg __________ times. Complete this exercise __________ times per day. POSTURE AND BODY MECHANICS CONSIDERATIONS - Sciatica Keeping correct posture when sitting, standing or completing your activities will reduce the stress put on different body tissues, allowing injured tissues a chance to heal and limiting painful experiences. The following are general guidelines for improved posture. Your physician or physical therapist will provide you with any instructions specific to your needs. While reading these guidelines, remember:  The exercises prescribed by your provider will help you have the flexibility and strength to  maintain correct postures.  The correct posture provides the optimal environment for your joints to work. All of your joints have less wear and tear when properly supported by a spine with good posture. This means you will experience a healthier, less painful body.  Correct posture must be practiced with all of your activities, especially prolonged sitting and standing. Correct posture is as important when doing repetitive low-stress activities (typing) as it is when doing a single heavy-load activity (lifting). RESTING POSITIONS Consider which positions are most painful for you when choosing a resting position. If you have pain with flexion-based activities (sitting, bending, stooping, squatting), choose a position that allows you to rest in a less flexed posture. You would want to avoid curling into a fetal position on your side. If your pain worsens with extension-based activities (  prolonged standing, working overhead), avoid resting in an extended position such as sleeping on your stomach. Most people will find more comfort when they rest with their spine in a more neutral position, neither too rounded nor too arched. Lying on a non-sagging bed on your side with a pillow between your knees, or on your back with a pillow under your knees will often provide some relief. Keep in mind, being in any one position for a prolonged period of time, no matter how correct your posture, can still lead to stiffness. PROPER SITTING POSTURE In order to minimize stress and discomfort on your spine, you must sit with correct posture Sitting with good posture should be effortless for a healthy body. Returning to good posture is a gradual process. Many people can work toward this most comfortably by using various supports until they have the flexibility and strength to maintain this posture on their own. When sitting with proper posture, your ears will fall over your shoulders and your shoulders will fall over your hips.  You should use the back of the chair to support your upper back. Your low back will be in a neutral position, just slightly arched. You may place a small pillow or folded towel at the base of your low back for support.  When working at a desk, create an environment that supports good, upright posture. Without extra support, muscles fatigue and lead to excessive strain on joints and other tissues. Keep these recommendations in mind: CHAIR:   A chair should be able to slide under your desk when your back makes contact with the back of the chair. This allows you to work closely.  The chair's height should allow your eyes to be level with the upper part of your monitor and your hands to be slightly lower than your elbows. BODY POSITION  Your feet should make contact with the floor. If this is not possible, use a foot rest.  Keep your ears over your shoulders. This will reduce stress on your neck and low back. INCORRECT SITTING POSTURES   If you are feeling tired and unable to assume a healthy sitting posture, do not slouch or slump. This puts excessive strain on your back tissues, causing more damage and pain. Healthier options include:  Using more support, like a lumbar pillow.  Switching tasks to something that requires you to be upright or walking.  Talking a brief walk.  Lying down to rest in a neutral-spine position. PROLONGED STANDING WHILE SLIGHTLY LEANING FORWARD  When completing a task that requires you to lean forward while standing in one place for a long time, place either foot up on a stationary 2-4 inch high object to help maintain the best posture. When both feet are on the ground, the low back tends to lose its slight inward curve. If this curve flattens (or becomes too large), then the back and your other joints will experience too much stress, fatigue more quickly and can cause pain.  CORRECT STANDING POSTURES Proper standing posture should be assumed with all daily  activities, even if they only take a few moments, like when brushing your teeth. As in sitting, your ears should fall over your shoulders and your shoulders should fall over your hips. You should keep a slight tension in your abdominal muscles to brace your spine. Your tailbone should point down to the ground, not behind your body, resulting in an over-extended swayback posture.  INCORRECT STANDING POSTURES  Common incorrect standing postures include a  forward head, locked knees and/or an excessive swayback. WALKING Walk with an upright posture. Your ears, shoulders and hips should all line-up. PROLONGED ACTIVITY IN A FLEXED POSITION When completing a task that requires you to bend forward at your waist or lean over a low surface, try to find a way to stabilize 3 of 4 of your limbs. You can place a hand or elbow on your thigh or rest a knee on the surface you are reaching across. This will provide you more stability so that your muscles do not fatigue as quickly. By keeping your knees relaxed, or slightly bent, you will also reduce stress across your low back. CORRECT LIFTING TECHNIQUES DO :   Assume a wide stance. This will provide you more stability and the opportunity to get as close as possible to the object which you are lifting.  Tense your abdominals to brace your spine; then bend at the knees and hips. Keeping your back locked in a neutral-spine position, lift using your leg muscles. Lift with your legs, keeping your back straight.  Test the weight of unknown objects before attempting to lift them.  Try to keep your elbows locked down at your sides in order get the best strength from your shoulders when carrying an object.  Always ask for help when lifting heavy or awkward objects. INCORRECT LIFTING TECHNIQUES DO NOT:   Lock your knees when lifting, even if it is a small object.  Bend and twist. Pivot at your feet or move your feet when needing to change directions.  Assume that you  cannot safely pick up a paperclip without proper posture.   This information is not intended to replace advice given to you by your health care provider. Make sure you discuss any questions you have with your health care provider.   Document Released: 12/27/2004 Document Revised: 05/13/2014 Document Reviewed: 04/10/2008 [employer] Interactive Patient Education [employer]

## 2015-02-27 NOTE — ED Notes (Signed)
Pt presents with L leg nerve pain x 4 days. The sharp pain starts at the left hip and shoots down to the left calf. Pt has broken L tibia and fibula twice per history. Using a cane to ambulate. Took 800 mg Ibuprofen and 500 mg of Tylenol PTA.

## 2015-02-27 NOTE — ED Provider Notes (Signed)
CSN: [id number]     Arrival date & time 02/27/15  7829 History   First MD Initiated Contact with Patient 02/27/15 0840     Chief Complaint  Patient presents with  . Leg Pain   Patient is a 55 y.o. male presenting with leg pain.  Leg Pain Location:  Buttock Injury: no   Buttock location:  L buttock Pain details:    Quality:  Pressure and shooting   Radiates to: posterior left thigh and calf.   Severity:  Moderate   Onset quality:  Gradual   Duration:  4 days   Timing:  Constant   Progression:  Unchanged Chronicity:  New Prior injury to area:  No Relieved by:  Rest Worsened by:  Bearing weight and extension Ineffective treatments:  None tried Associated symptoms: no back pain, no decreased ROM, no fever, no muscle weakness and no numbness   Risk factors: obesity     James Parker is a 55 year old male with a past medical history of obesity and diabetes presenting with back pain. Onset of pain was 4 days ago. Pain came on gradually and is located in the left buttock. The pain radiates down the back of his left thigh into his left calf. He states it feels like he is "sitting on a metal ball" that is digging into his buttock. He states when he ambulates or straightens his leg in a sitting position the pain radiates down the back of his leg and feels like a tight and shooting sensation. He has never had this pain before. He uses a cane to ambulate at baseline. He denies history of injury to the leg. He states the pain starts in his buttock and is not in his lumbar region. He has not taken any medications prior to arrival. Denies fevers, chills, abdominal pain, change in bowel or bladder control or numbness of the lower extremities, saddle paresthesias or weakness of the lower extremities. He states ambulating is painful but he is still able to get around. 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 He has no other complaints today.   Past Medical History  Diagnosis Date  .  Diabetes mellitus   . Internal hemorrhoids   . Obesity    No past surgical history on file. History reviewed. No pertinent family history. Social History  Substance Use Topics  . Smoking status: Former Games developer  . Smokeless tobacco: None  . Alcohol Use: No    Review of Systems  Constitutional: Negative for fever.  Musculoskeletal: Positive for myalgias. Negative for back pain.  All other systems reviewed and are negative.     Allergies  Ibuprofen  Home Medications   Prior to Admission medications   Medication Sig Start Date End Date Taking? Authorizing Provider  doxycycline (VIBRAMYCIN) 100 MG capsule Take 1 capsule (100 mg total) by mouth 2 (two) times daily. 05/21/14   Raelyn Ensign, PA  HYDROcodone-acetaminophen (NORCO) 5-325 MG per tablet Take 1 tablet by mouth every 6 (six) hours as needed. 05/21/14   Raelyn Ensign, PA  predniSONE (DELTASONE) 50 MG tablet Take 1 tablet once a day for 5 days 02/27/15   Lukus Binion, PA-C   BP 122/76 mmHg  Pulse 67  Ht  (1.727 m)  Wt 127.007 kg  BMI 42.58 kg/m2  SpO2 95% Physical Exam  Constitutional: He appears well-developed and well-nourished. No distress.  HENT:  Head: Normocephalic and atraumatic.  Eyes: Conjunctivae are normal. Right eye exhibits no  discharge. Left eye exhibits no discharge. No scleral icterus.  Neck: Normal range of motion.  Cardiovascular: Normal rate.   Pulmonary/Chest: Effort normal. No respiratory distress.  Abdominal: Soft. There is no tenderness.  Musculoskeletal: Normal range of motion.       Lumbar back: He exhibits normal range of motion, no tenderness and no deformity.       Left upper leg: He exhibits tenderness. He exhibits no swelling and no deformity.       Legs: Tenderness over her left SI joint and down posterior thigh. No tenderness over the lumbar region. No tenderness over the lumbar spine. No bony deformities. Full range of motion of the lumbar back and bilateral lower extremities  intact. No joint swelling or edema. Positive straight leg raise on the left. Patient ambulates with a cane and favors his left leg.  Neurological: He is alert. Coordination normal.  5/5 strength in the bilateral lower extremities. Sensation to light touch intact throughout.  Skin: Skin is warm and dry. No rash noted.  No rash over the bilateral lower extremities  Psychiatric: He has a normal mood and affect. His behavior is normal.  Nursing note and vitals reviewed.   ED Course  Procedures (including critical care time) Labs Review Labs Reviewed - No data to display  Imaging Review No results found. I have personally reviewed and evaluated these images and lab results as part of my medical decision-making.   EKG Interpretation None      MDM   Final diagnoses:  Sciatica of left side   55 year old man presenting with left leg pain 4 days. Pain originates in the left but talk and radiates down posterior leg. Vital signs stable and patient is nontoxic-appearing. Normal neurological exam, pain is reproducible to palpation and on straight leg raise. Bilateral lower extremities are neurovascularly intact with full range of motion. Patient can walk but states is painful. Presentation consistent with sciatica. No loss of bowel or bladder control.  No concern for cauda equina. No fever, night sweats, weight loss, h/o cancer, IVDU.  Pain treated here in the department with adequate improvement. Conservative therapy including heat, ice and over-the-counter pain relievers discussed with patient. Will discharge on 5 day steroid burst. Patient is to schedule a follow-up appointment with his primary care provider for a visit in 5 days. I have also discussed reasons to return immediately to the ER.  Patient expresses understanding and agrees with plan.    James Gala Davius Goudeau, PA-C 02/27/15 4098  Mancel Bale, MD 02/28/15 [phone]

## 2017-01-09 ENCOUNTER — Encounter (HOSPITAL_COMMUNITY): Payer: Self-pay | Admitting: Emergency Medicine

## 2017-01-09 ENCOUNTER — Emergency Department (HOSPITAL_COMMUNITY): Payer: Non-veteran care

## 2017-01-09 DIAGNOSIS — R079 Chest pain, unspecified: Secondary | ICD-10-CM | POA: Diagnosis present

## 2017-01-09 DIAGNOSIS — Z5321 Procedure and treatment not carried out due to patient leaving prior to being seen by health care provider: Secondary | ICD-10-CM | POA: Insufficient documentation

## 2017-01-09 LAB — BASIC METABOLIC PANEL
Anion gap: 9 (ref 5–15)
BUN: 16 mg/dL (ref 6–20)
CHLORIDE: 103 mmol/L (ref 101–111)
CO2: 24 mmol/L (ref 22–32)
CREATININE: 1.01 mg/dL (ref 0.61–1.24)
Calcium: 9.2 mg/dL (ref 8.9–10.3)
GFR calc non Af Amer: >60 mL/min (ref >60)
Glucose, Bld: 190 mg/dL — ABNORMAL HIGH (ref 65–99)
Potassium: 4.4 mmol/L (ref 3.5–5.1)
Sodium: 136 mmol/L (ref 135–145)

## 2017-01-09 LAB — CBC
HCT: 45.6 % (ref 39.0–52.0)
Hemoglobin: 14.8 g/dL (ref 13.0–17.0)
MCH: 27.5 pg (ref 26.0–34.0)
MCHC: 32.5 g/dL (ref 30.0–36.0)
MCV: 84.6 fL (ref 78.0–100.0)
PLATELETS: 280 10*3/uL (ref 150–400)
RBC: 5.39 MIL/uL (ref 4.22–5.81)
RDW: 14.2 % (ref 11.5–15.5)
WBC: 7 10*3/uL (ref 4.0–10.5)

## 2017-01-09 LAB — I-STAT TROPONIN, ED: Troponin i, poc: 0 ng/mL (ref 0.00–0.08)

## 2017-01-09 NOTE — ED Triage Notes (Signed)
Pt arrives via POV for chest pain x1 week, onset while resting. Pt reports that it got worse today and yesterday evening, prompting his visit. Diaphoretic in triage after walking in, pt drying up after resting in wheelchair.

## 2017-01-10 ENCOUNTER — Emergency Department (HOSPITAL_COMMUNITY)
Admission: EM | Admit: 2017-01-10 | Discharge: 2017-01-10 | Discharge status: Against Medical Advice | Payer: Non-veteran care | Attending: Emergency Medicine | Admitting: Emergency Medicine

## 2017-01-10 NOTE — ED Notes (Signed)
RN informed by registration that pt left without being seen at 0345

## 2017-01-10 NOTE — ED Notes (Signed)
Pt left ED lobby to eat, has returned to lobby.

## 2018-01-28 ENCOUNTER — Emergency Department (HOSPITAL_COMMUNITY): Payer: No Typology Code available for payment source | Admitting: Anesthesiology

## 2018-01-28 ENCOUNTER — Other Ambulatory Visit: Payer: Self-pay

## 2018-01-28 ENCOUNTER — Encounter: Payer: Self-pay | Admitting: Family Medicine

## 2018-01-28 ENCOUNTER — Encounter (HOSPITAL_COMMUNITY)
Admission: EM | Disposition: A | Discharge status: Home / Self Care | Payer: Self-pay | Source: Home / Self Care | Attending: Internal Medicine

## 2018-01-28 ENCOUNTER — Emergency Department (HOSPITAL_COMMUNITY): Payer: No Typology Code available for payment source

## 2018-01-28 ENCOUNTER — Observation Stay (HOSPITAL_COMMUNITY)
Admission: EM | Admit: 2018-01-28 | Discharge: 2018-01-29 | Disposition: A | Discharge status: Home / Self Care | Payer: No Typology Code available for payment source | Attending: Internal Medicine | Admitting: Internal Medicine

## 2018-01-28 ENCOUNTER — Encounter (HOSPITAL_COMMUNITY): Payer: Self-pay

## 2018-01-28 DIAGNOSIS — I872 Venous insufficiency (chronic) (peripheral): Secondary | ICD-10-CM | POA: Insufficient documentation

## 2018-01-28 DIAGNOSIS — S0591XA Unspecified injury of right eye and orbit, initial encounter: Secondary | ICD-10-CM | POA: Diagnosis present

## 2018-01-28 DIAGNOSIS — S0531XA Ocular laceration without prolapse or loss of intraocular tissue, right eye, initial encounter: Secondary | ICD-10-CM | POA: Diagnosis not present

## 2018-01-28 DIAGNOSIS — S0530XA Ocular laceration without prolapse or loss of intraocular tissue, unspecified eye, initial encounter: Secondary | ICD-10-CM | POA: Diagnosis present

## 2018-01-28 DIAGNOSIS — S01111A Laceration without foreign body of right eyelid and periocular area, initial encounter: Principal | ICD-10-CM | POA: Insufficient documentation

## 2018-01-28 DIAGNOSIS — Z886 Allergy status to analgesic agent status: Secondary | ICD-10-CM | POA: Diagnosis not present

## 2018-01-28 DIAGNOSIS — G4733 Obstructive sleep apnea (adult) (pediatric): Secondary | ICD-10-CM | POA: Diagnosis not present

## 2018-01-28 DIAGNOSIS — Z9989 Dependence on other enabling machines and devices: Secondary | ICD-10-CM

## 2018-01-28 DIAGNOSIS — Y92511 Restaurant or cafe as the place of occurrence of the external cause: Secondary | ICD-10-CM | POA: Insufficient documentation

## 2018-01-28 DIAGNOSIS — E1165 Type 2 diabetes mellitus with hyperglycemia: Secondary | ICD-10-CM | POA: Diagnosis not present

## 2018-01-28 DIAGNOSIS — Z792 Long term (current) use of antibiotics: Secondary | ICD-10-CM | POA: Diagnosis not present

## 2018-01-28 DIAGNOSIS — Z87891 Personal history of nicotine dependence: Secondary | ICD-10-CM | POA: Diagnosis not present

## 2018-01-28 DIAGNOSIS — D62 Acute posthemorrhagic anemia: Secondary | ICD-10-CM | POA: Diagnosis present

## 2018-01-28 DIAGNOSIS — Z6841 Body Mass Index (BMI) 40.0 and over, adult: Secondary | ICD-10-CM | POA: Diagnosis not present

## 2018-01-28 DIAGNOSIS — Z7952 Long term (current) use of systemic steroids: Secondary | ICD-10-CM | POA: Insufficient documentation

## 2018-01-28 DIAGNOSIS — E119 Type 2 diabetes mellitus without complications: Secondary | ICD-10-CM

## 2018-01-28 DIAGNOSIS — G8918 Other acute postprocedural pain: Secondary | ICD-10-CM | POA: Diagnosis present

## 2018-01-28 HISTORY — PX: RUPTURED GLOBE EXPLORATION AND REPAIR: SHX2366

## 2018-01-28 HISTORY — PX: EYELID LACERATION REPAIR: SHX5333

## 2018-01-28 LAB — CBC WITH DIFFERENTIAL/PLATELET
Abs Immature Granulocytes: 0.01 10*3/uL (ref 0.00–0.07)
Basophils Absolute: 0 10*3/uL (ref 0.0–0.1)
Basophils Relative: 1 %
Eosinophils Absolute: 0.1 10*3/uL (ref 0.0–0.5)
Eosinophils Relative: 1 %
HCT: 51.5 % (ref 39.0–52.0)
Hemoglobin: 15.7 g/dL (ref 13.0–17.0)
Immature Granulocytes: 0 %
Lymphocytes Relative: 23 %
Lymphs Abs: 1.3 10*3/uL (ref 0.7–4.0)
MCH: 26 pg (ref 26.0–34.0)
MCHC: 30.5 g/dL (ref 30.0–36.0)
MCV: 85.4 fL (ref 80.0–100.0)
Monocytes Absolute: 0.5 10*3/uL (ref 0.1–1.0)
Monocytes Relative: 9 %
Neutro Abs: 3.9 10*3/uL (ref 1.7–7.7)
Neutrophils Relative %: 66 %
Platelets: 260 10*3/uL (ref 150–400)
RBC: 6.03 MIL/uL — ABNORMAL HIGH (ref 4.22–5.81)
RDW: 13 % (ref 11.5–15.5)
WBC: 5.8 10*3/uL (ref 4.0–10.5)
nRBC: 0 % (ref 0.0–0.2)

## 2018-01-28 LAB — BASIC METABOLIC PANEL
Anion gap: 13 (ref 5–15)
BUN: 13 mg/dL (ref 6–20)
CO2: 24 mmol/L (ref 22–32)
Calcium: 9.4 mg/dL (ref 8.9–10.3)
Chloride: 99 mmol/L (ref 98–111)
Creatinine, Ser: 1.04 mg/dL (ref 0.61–1.24)
GFR calc Af Amer: >60 mL/min (ref >60)
GFR calc non Af Amer: >60 mL/min (ref >60)
Glucose, Bld: 300 mg/dL — ABNORMAL HIGH (ref 70–99)
Potassium: 4.3 mmol/L (ref 3.5–5.1)
Sodium: 136 mmol/L (ref 135–145)

## 2018-01-28 LAB — GLUCOSE, CAPILLARY
Glucose-Capillary: 381 mg/dL — ABNORMAL HIGH (ref 70–99)
Glucose-Capillary: 363 mg/dL — ABNORMAL HIGH (ref 70–99)
Glucose-Capillary: 367 mg/dL — ABNORMAL HIGH (ref 70–99)

## 2018-01-28 SURGERY — REPAIR, RUPTURE, GLOBE
Anesthesia: General | Site: Eye | Laterality: Right

## 2018-01-28 MED ORDER — BSS IO SOLN
INTRAOCULAR | Status: DC | PRN
  Administered 2018-01-28: 30 mL via INTRAOCULAR

## 2018-01-28 MED ORDER — PHENYLEPHRINE 40 MCG/ML (10ML) SYRINGE FOR IV PUSH (FOR BLOOD PRESSURE SUPPORT)
PREFILLED_SYRINGE | INTRAVENOUS | Status: DC | PRN
  Administered 2018-01-28: 40 ug via INTRAVENOUS
  Administered 2018-01-28 (×5): 80 ug via INTRAVENOUS

## 2018-01-28 MED ORDER — PHENYLEPHRINE 40 MCG/ML (10ML) SYRINGE FOR IV PUSH (FOR BLOOD PRESSURE SUPPORT)
PREFILLED_SYRINGE | INTRAVENOUS | Status: AC
  Filled 2018-01-28: qty 10

## 2018-01-28 MED ORDER — OXYCODONE-ACETAMINOPHEN 5-325 MG PO TABS
1.0000 | ORAL_TABLET | ORAL | Status: DC | PRN
  Administered 2018-01-29: 1 via ORAL
  Administered 2018-01-29: 2 via ORAL
  Filled 2018-01-28 (×3): qty 1

## 2018-01-28 MED ORDER — SODIUM CHLORIDE 0.9 % IV BOLUS
500.0000 mL | Freq: Once | INTRAVENOUS | Status: AC
  Administered 2018-01-28: 500 mL via INTRAVENOUS

## 2018-01-28 MED ORDER — BACITRACIN-POLYMYXIN B 500-10000 UNIT/GM OP OINT
1.0000 "application " | TOPICAL_OINTMENT | Freq: Two times a day (BID) | OPHTHALMIC | Status: DC
  Administered 2018-01-29: 1 via OPHTHALMIC
  Filled 2018-01-28: qty 3.5

## 2018-01-28 MED ORDER — HYDROMORPHONE HCL 1 MG/ML IJ SOLN
0.5000 mg | Freq: Once | INTRAMUSCULAR | Status: AC | PRN
  Administered 2018-01-28: 0.5 mg via INTRAVENOUS
  Filled 2018-01-28: qty 1

## 2018-01-28 MED ORDER — SUGAMMADEX SODIUM 500 MG/5ML IV SOLN
INTRAVENOUS | Status: AC
  Filled 2018-01-28: qty 5

## 2018-01-28 MED ORDER — PROPOFOL 10 MG/ML IV BOLUS
INTRAVENOUS | Status: AC
  Filled 2018-01-28: qty 20

## 2018-01-28 MED ORDER — INSULIN ASPART 100 UNIT/ML ~~LOC~~ SOLN
SUBCUTANEOUS | Status: AC
  Administered 2018-01-28: 8 [IU] via SUBCUTANEOUS
  Filled 2018-01-28: qty 1

## 2018-01-28 MED ORDER — LIDOCAINE 2% (20 MG/ML) 5 ML SYRINGE
INTRAMUSCULAR | Status: AC
  Filled 2018-01-28: qty 5

## 2018-01-28 MED ORDER — ONDANSETRON HCL 4 MG/2ML IJ SOLN
INTRAMUSCULAR | Status: AC
  Filled 2018-01-28: qty 2

## 2018-01-28 MED ORDER — FLUORESCEIN SODIUM 1 MG OP STRP
ORAL_STRIP | OPHTHALMIC | Status: AC
  Filled 2018-01-28: qty 1

## 2018-01-28 MED ORDER — ATROPINE SULFATE 1 % OP SOLN
1.0000 [drp] | Freq: Two times a day (BID) | OPHTHALMIC | Status: DC
  Administered 2018-01-29: 1 [drp] via OPHTHALMIC
  Filled 2018-01-28 (×2): qty 2

## 2018-01-28 MED ORDER — FENTANYL CITRATE (PF) 100 MCG/2ML IJ SOLN
INTRAMUSCULAR | Status: AC
  Administered 2018-01-28: 50 ug via INTRAVENOUS
  Filled 2018-01-28: qty 2

## 2018-01-28 MED ORDER — LIDOCAINE 2% (20 MG/ML) 5 ML SYRINGE
INTRAMUSCULAR | Status: DC | PRN
  Administered 2018-01-28: 100 mg via INTRAVENOUS

## 2018-01-28 MED ORDER — PREDNISOLONE ACETATE 1 % OP SUSP
1.0000 [drp] | Freq: Three times a day (TID) | OPHTHALMIC | Status: DC
  Administered 2018-01-29: 1 [drp] via OPHTHALMIC
  Filled 2018-01-28 (×2): qty 5

## 2018-01-28 MED ORDER — INSULIN ASPART 100 UNIT/ML ~~LOC~~ SOLN
0.0000 [IU] | Freq: Three times a day (TID) | SUBCUTANEOUS | Status: DC
  Administered 2018-01-28: 15 [IU] via SUBCUTANEOUS
  Administered 2018-01-29: 8 [IU] via SUBCUTANEOUS
  Administered 2018-01-29: 5 [IU] via SUBCUTANEOUS

## 2018-01-28 MED ORDER — FENTANYL CITRATE (PF) 100 MCG/2ML IJ SOLN
25.0000 ug | INTRAMUSCULAR | Status: DC | PRN
  Administered 2018-01-28 (×2): 50 ug via INTRAVENOUS

## 2018-01-28 MED ORDER — FLUORESCEIN SODIUM 1 MG OP STRP
1.0000 | ORAL_STRIP | Freq: Once | OPHTHALMIC | Status: AC
  Administered 2018-01-28: 1 via OPHTHALMIC

## 2018-01-28 MED ORDER — PROMETHAZINE HCL 25 MG/ML IJ SOLN: 6.2500 mg | INTRAMUSCULAR | Status: DC | PRN

## 2018-01-28 MED ORDER — INSULIN ASPART 100 UNIT/ML ~~LOC~~ SOLN
8.0000 [IU] | Freq: Once | SUBCUTANEOUS | Status: AC
  Administered 2018-01-28: 8 [IU] via SUBCUTANEOUS

## 2018-01-28 MED ORDER — SUCCINYLCHOLINE CHLORIDE 200 MG/10ML IV SOSY
PREFILLED_SYRINGE | INTRAVENOUS | Status: AC
  Filled 2018-01-28: qty 10

## 2018-01-28 MED ORDER — HYDROMORPHONE HCL 1 MG/ML IJ SOLN
INTRAMUSCULAR | Status: AC
  Administered 2018-01-28: 0.5 mg via INTRAVENOUS
  Filled 2018-01-28: qty 1

## 2018-01-28 MED ORDER — MIDAZOLAM HCL 2 MG/2ML IJ SOLN
INTRAMUSCULAR | Status: AC
  Filled 2018-01-28: qty 2

## 2018-01-28 MED ORDER — MEPERIDINE HCL 50 MG/ML IJ SOLN: 6.2500 mg | INTRAMUSCULAR | Status: DC | PRN

## 2018-01-28 MED ORDER — FENTANYL CITRATE (PF) 250 MCG/5ML IJ SOLN
INTRAMUSCULAR | Status: DC | PRN
  Administered 2018-01-28 (×2): 50 ug via INTRAVENOUS

## 2018-01-28 MED ORDER — CEFAZOLIN SODIUM-DEXTROSE 2-3 GM-%(50ML) IV SOLR
INTRAVENOUS | Status: DC | PRN
  Administered 2018-01-28: 2 g via INTRAVENOUS

## 2018-01-28 MED ORDER — LIDOCAINE HCL 2 % IJ SOLN
20.0000 mL | Freq: Once | INTRAMUSCULAR | Status: DC
  Filled 2018-01-28: qty 20

## 2018-01-28 MED ORDER — BSS IO SOLN
INTRAOCULAR | Status: AC
  Filled 2018-01-28: qty 15

## 2018-01-28 MED ORDER — INSULIN ASPART 100 UNIT/ML ~~LOC~~ SOLN
SUBCUTANEOUS | Status: AC
  Filled 2018-01-28: qty 1

## 2018-01-28 MED ORDER — MORPHINE SULFATE (PF) 4 MG/ML IV SOLN
4.0000 mg | Freq: Once | INTRAVENOUS | Status: AC
  Administered 2018-01-28: 4 mg via INTRAMUSCULAR

## 2018-01-28 MED ORDER — FENTANYL CITRATE (PF) 250 MCG/5ML IJ SOLN
INTRAMUSCULAR | Status: AC
  Filled 2018-01-28: qty 5

## 2018-01-28 MED ORDER — TETRACAINE HCL 0.5 % OP SOLN
2.0000 [drp] | Freq: Once | OPHTHALMIC | Status: AC
  Administered 2018-01-28: 2 [drp] via OPHTHALMIC
  Filled 2018-01-28: qty 4

## 2018-01-28 MED ORDER — SUCCINYLCHOLINE CHLORIDE 200 MG/10ML IV SOSY
PREFILLED_SYRINGE | INTRAVENOUS | Status: DC | PRN
  Administered 2018-01-28: 160 mg via INTRAVENOUS

## 2018-01-28 MED ORDER — LEVOFLOXACIN IN D5W 750 MG/150ML IV SOLN
750.0000 mg | INTRAVENOUS | Status: AC
  Administered 2018-01-28: 750 mg via INTRAVENOUS
  Filled 2018-01-28: qty 150

## 2018-01-28 MED ORDER — SUGAMMADEX SODIUM 200 MG/2ML IV SOLN
INTRAVENOUS | Status: DC | PRN
  Administered 2018-01-28: 260 mg via INTRAVENOUS

## 2018-01-28 MED ORDER — MORPHINE SULFATE (PF) 4 MG/ML IV SOLN
4.0000 mg | Freq: Once | INTRAVENOUS | Status: DC
  Filled 2018-01-28: qty 1

## 2018-01-28 MED ORDER — PROPOFOL 10 MG/ML IV BOLUS
INTRAVENOUS | Status: DC | PRN
  Administered 2018-01-28: 200 mg via INTRAVENOUS

## 2018-01-28 MED ORDER — ROCURONIUM BROMIDE 50 MG/5ML IV SOSY
PREFILLED_SYRINGE | INTRAVENOUS | Status: AC
  Filled 2018-01-28: qty 5

## 2018-01-28 MED ORDER — LACTATED RINGERS IV SOLN
INTRAVENOUS | Status: DC | PRN
  Administered 2018-01-28: 18:00:00 via INTRAVENOUS

## 2018-01-28 MED ORDER — METFORMIN HCL 500 MG PO TABS
1000.0000 mg | ORAL_TABLET | Freq: Two times a day (BID) | ORAL | Status: DC
  Administered 2018-01-29: 1000 mg via ORAL
  Filled 2018-01-28: qty 2

## 2018-01-28 MED ORDER — ROCURONIUM BROMIDE 10 MG/ML (PF) SYRINGE
PREFILLED_SYRINGE | INTRAVENOUS | Status: DC | PRN
  Administered 2018-01-28: 40 mg via INTRAVENOUS

## 2018-01-28 MED ORDER — HYDROMORPHONE HCL 1 MG/ML IJ SOLN
0.2500 mg | INTRAMUSCULAR | Status: DC | PRN
  Administered 2018-01-28 (×2): 0.5 mg via INTRAVENOUS

## 2018-01-28 MED ORDER — BACITRACIN-POLYMYXIN B 500-10000 UNIT/GM OP OINT
TOPICAL_OINTMENT | OPHTHALMIC | Status: AC
  Filled 2018-01-28: qty 3.5

## 2018-01-28 MED ORDER — ONDANSETRON HCL 4 MG/2ML IJ SOLN
INTRAMUSCULAR | Status: DC | PRN
  Administered 2018-01-28: 4 mg via INTRAVENOUS

## 2018-01-28 SURGICAL SUPPLY — 30 items
APPLICATOR DR MATTHEWS STRL (MISCELLANEOUS) IMPLANT
BAG ISOLATION DRAPE 18X18 (DRAPES) IMPLANT
BLADE EYE CATARACT 19 1.4 BEAV (BLADE) IMPLANT
BLADE MVR KNIFE 20G (BLADE) IMPLANT
BNDG EYE OVAL (GAUZE/BANDAGES/DRESSINGS) ×3 IMPLANT
CAUTERY EYE LOW TEMP 1300F FIN (OPHTHALMIC RELATED) ×3 IMPLANT
CORDS BIPOLAR (ELECTRODE) ×3 IMPLANT
COVER SURGICAL LIGHT HANDLE (MISCELLANEOUS) ×3 IMPLANT
COVER WAND RF STERILE (DRAPES) ×3 IMPLANT
DRAPE ISOLATION BAG 18X18 (DRAPES)
DRAPE OPHTHALMIC 40X48 W POUCH (DRAPES) ×3 IMPLANT
ERASER HMR WETFIELD 23G BP (MISCELLANEOUS) ×3 IMPLANT
GLOVE ECLIPSE 7.5 STRL STRAW (GLOVE) ×3 IMPLANT
GOWN STRL REUS W/ TWL LRG LVL3 (GOWN DISPOSABLE) ×1 IMPLANT
GOWN STRL REUS W/TWL LRG LVL3 (GOWN DISPOSABLE) ×2
KIT BASIN OR (CUSTOM PROCEDURE TRAY) ×3 IMPLANT
LENS BIOM SUPER VIEW SET DISP (OPHTHALMIC RELATED) IMPLANT
NS IRRIG 1000ML POUR BTL (IV SOLUTION) ×3 IMPLANT
PACK CATARACT CUSTOM (CUSTOM PROCEDURE TRAY) ×3 IMPLANT
PAD ARMBOARD 7.5X6 YLW CONV (MISCELLANEOUS) ×6 IMPLANT
ROLLS DENTAL (MISCELLANEOUS) IMPLANT
SHIELD EYE LENSE ONLY DISP (GAUZE/BANDAGES/DRESSINGS) ×3 IMPLANT
SOLUTION ANTI FOG 6CC (MISCELLANEOUS) ×3 IMPLANT
SPEAR EYE SURG WECK-CEL (MISCELLANEOUS) ×6 IMPLANT
SPECIMEN JAR SMALL (MISCELLANEOUS) IMPLANT
SUT CHROMIC 7 0 TG140 8 (SUTURE) ×6 IMPLANT
SUT ETHILON 10 0 CS140 6 (SUTURE) ×6 IMPLANT
SUT ETHILON 9 0 TG140 8 (SUTURE) ×3 IMPLANT
TOWEL OR 17X24 6PK STRL BLUE (TOWEL DISPOSABLE) ×6 IMPLANT
WATER STERILE IRR 1000ML POUR (IV SOLUTION) ×3 IMPLANT

## 2018-01-28 NOTE — ED Notes (Signed)
Patient transported to CT 

## 2018-01-28 NOTE — Anesthesia Preprocedure Evaluation (Signed)
Anesthesia Evaluation  Patient identified by MRN, date of birth, ID band Patient awake    Reviewed: Allergy & Precautions, NPO status , Patient's Chart, lab work & pertinent test results  Airway Mallampati: II  TM Distance: >3 FB Neck ROM: Full    Dental no notable dental hx.    Pulmonary former smoker,    Pulmonary exam normal breath sounds clear to auscultation       Cardiovascular negative cardio ROS Normal cardiovascular exam Rhythm:Regular Rate:Normal     Neuro/Psych negative neurological ROS  negative psych ROS   GI/Hepatic negative GI ROS, Neg liver ROS,   Endo/Other  diabetes, Type 2  Renal/GU negative Renal ROS     Musculoskeletal negative musculoskeletal ROS (+)   Abdominal   Peds  Hematology  (+) anemia ,   Anesthesia Other Findings   Reproductive/Obstetrics negative OB ROS                             Anesthesia Physical Anesthesia Plan  ASA: III and emergent  Anesthesia Plan: General   Post-op Pain Management:    Induction: Intravenous  PONV Risk Score and Plan: 4 or greater and Ondansetron and Dexamethasone  Airway Management Planned: Oral ETT  Additional Equipment: None  Intra-op Plan:   Post-operative Plan: Extubation in OR  Informed Consent: I have reviewed the patients History and Physical, chart, labs and discussed the procedure including the risks, benefits and alternatives for the proposed anesthesia with the patient or authorized representative who has indicated his/her understanding and acceptance.     Dental advisory given  Plan Discussed with: CRNA  Anesthesia Plan Comments:         Anesthesia Quick Evaluation
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
## 2018-01-28 NOTE — ED Provider Notes (Signed)
Emergency Ultrasound Study:   Angiocath insertion Performed by: Sabas Sous  Consent: Verbal consent obtained. Risks and benefits: risks, benefits and alternatives were discussed Immediately prior to procedure the correct patient, procedure, equipment, support staff and site/side marked as needed.  Indication: difficult IV access Preparation: Patient was prepped and draped in the usual sterile fashion. Vein Location: A left antecubital vein was visualized during assessment for potential access sites and was found to be patent/ easily compressed with linear ultrasound.  The needle was visualized with real-time ultrasound and guided into the vein. Gauge: 20  Images were not saved due to the time sensitive nature of the situation. Normal blood return.  Patient tolerance: Patient tolerated the procedure well with no immediate complications.    Sabas Sous, MD 01/28/18 [phone]

## 2018-01-28 NOTE — Transfer of Care (Signed)
Immediate Anesthesia Transfer of Care Note  Patient: James Parker  Procedure(s) Performed: EXPLORATION AND REPAIR OF RUPTURED GLOBE (Right Eye) EYELID LACERATION REPAIR (Right Eye)  Patient Location: PACU  Anesthesia Type:General  Level of Consciousness: awake, alert  and oriented  Airway & Oxygen Therapy: Patient Spontanous Breathing and Patient connected to face mask oxygen  Post-op Assessment: Report given to RN, Post -op Vital signs reviewed and stable and Patient moving all extremities X 4  Post vital signs: Reviewed and stable  Last Vitals:  Vitals Value Taken Time  BP    Temp    Pulse 88 01/28/2018  7:58 PM  Resp 23 01/28/2018  7:58 PM  SpO2 93 % 01/28/2018  7:58 PM  Vitals shown include unvalidated device data.  Last Pain:  Vitals:   01/28/18 1743  TempSrc:   PainSc: 10-Worst pain ever         Complications: No apparent anesthesia complications

## 2018-01-28 NOTE — ED Notes (Signed)
Pt is being questioned by [employer] at this time.
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
## 2018-01-28 NOTE — Anesthesia Postprocedure Evaluation (Signed)
Anesthesia Post Note  Patient: James Parker  Procedure(s) Performed: EXPLORATION AND REPAIR OF RUPTURED GLOBE (Right Eye) EYELID LACERATION REPAIR (Right Eye)     Patient location during evaluation: PACU Anesthesia Type: General Level of consciousness: sedated and patient cooperative Pain management: pain level controlled Vital Signs Assessment: post-procedure vital signs reviewed and stable Respiratory status: spontaneous breathing Cardiovascular status: stable Anesthetic complications: no    Last Vitals:  Vitals:   01/28/18 2215 01/28/18 2229  BP:  (!) 144/89  Pulse: 75 78  Resp: (!) 24 (!) 25  Temp:    SpO2: 94% 96%    Last Pain:  Vitals:   01/28/18 1743  TempSrc:   PainSc: 10-Worst pain ever                 Lewie Loron

## 2018-01-28 NOTE — Op Note (Signed)
PATIENT NAME: James Parker. [facility] NUMBER: [id number] DATE OF SERVICE: 01/28/2018 DATE OF BIRTH: 28-Sep-1960  PREOPERATIVE DIAGNOSES:  Possible globe rupture, right eye  POSTOPERATIVE DIAGNOSIS:  Possible globe rupture, right eye Right upper eyelid laceration  NAME OF PROCEDURE:  Globe exploration, right eye Right upper eyelid laceration repair  SURGEONS: Madilyn Hook, MD  ANESTHESIA: General.  COMPLICATIONS: None.  SPECIMENS: None.  ESTIMATED BLOOD LOSS: Less than 5 mL.  DESCRIPTION OF PROCEDURE: After obtaining informed consent, the patient was brought back to the operating room and laid supine on the operating room table. General anesthesia was induced without difficulty and the patient was prepped and draped in the usual sterile fashion.  A speculum was gently placed. A 360 degree peritomy was performed. Each quadrant was observed with blunt dissection at least 69mm posterior to the limbus. No globe rupture was seen. The conjunctiva was closed with 7-0 chromic sutures. Attention was then directed to the 57mm right upper eyelid laceration. Running 7-0 chromic suture was used to close the wound. Ointment was instilled and a pressure patch placed The patient was extubated and returned to the recovery room in good and stable condition.   Will plan for 23hr obs admission by med hospitalist. I will check on him in the morning and remove the patch. Once the patch is removed he will need pred forte QID, atropine BID, and ABx ointment BID to eye and laceration.

## 2018-01-28 NOTE — ED Notes (Signed)
Called CT to get this pt.

## 2018-01-28 NOTE — Op Note (Signed)
PATIENT:  James Parker DOB: March 26, 2060  PRE-OPERATIVE DIAGNOSIS:   Possible globe rupture, right eye  POST-OPERATIVE DIAGNOSIS:   Possible globe rupture, right eye  Right upper eyelid laceration  PROCEDURE:  Procedure(s): RIGHT GLOBE EXPLORATION RIGHT UPPER EYELID LACERATION REPAIR (Right)  SURGEON:  Surgeon(s) and Role:    * Sallye Lat, MD - Primary  ANESTHESIA:   general  EBL: Less than 5cc  BLOOD ADMINISTERED: none  DRAINS: none   LOCAL MEDICATIONS USED:  NONE  SPECIMEN:  No Specimen  DISPOSITION OF SPECIMEN:  N/A  COUNTS:  YES   TOURNIQUET:  * No tourniquets in log *  DICTATION: Note written in EPIC  PLAN OF CARE: 23 hr obs admission by hospitalist  PATIENT DISPOSITION:  PACU - hemodynamically stable.

## 2018-01-28 NOTE — Anesthesia Procedure Notes (Signed)
Procedure Name: Intubation Date/Time: 01/28/2018 6:41 PM Performed by: Rachel Moulds, CRNA Pre-anesthesia Checklist: Patient identified, Emergency Drugs available, Patient being monitored, Suction available and Timeout performed Patient Re-evaluated:Patient Re-evaluated prior to induction Oxygen Delivery Method: Circle system utilized Preoxygenation: Pre-oxygenation with 100% oxygen Induction Type: IV induction, Rapid sequence and Cricoid Pressure applied Grade View: Grade I Tube type: Oral Tube size: 7.5 mm Number of attempts: 1 Airway Equipment and Method: Stylet and Video-laryngoscopy Placement Confirmation: ETT inserted through vocal cords under direct vision,  positive ETCO2,  CO2 detector and breath sounds checked- equal and bilateral Secured at: 24 cm Tube secured with: Tape Dental Injury: Teeth and Oropharynx as per pre-operative assessment

## 2018-01-29 ENCOUNTER — Encounter (HOSPITAL_COMMUNITY): Payer: Self-pay | Admitting: Optometry

## 2018-01-29 DIAGNOSIS — S0531XA Ocular laceration without prolapse or loss of intraocular tissue, right eye, initial encounter: Secondary | ICD-10-CM | POA: Diagnosis not present

## 2018-01-29 DIAGNOSIS — G4733 Obstructive sleep apnea (adult) (pediatric): Secondary | ICD-10-CM | POA: Diagnosis not present

## 2018-01-29 DIAGNOSIS — G8918 Other acute postprocedural pain: Secondary | ICD-10-CM | POA: Diagnosis not present

## 2018-01-29 DIAGNOSIS — D62 Acute posthemorrhagic anemia: Secondary | ICD-10-CM

## 2018-01-29 LAB — CBC
HCT: 48.2 % (ref 39.0–52.0)
Hemoglobin: 14.7 g/dL (ref 13.0–17.0)
MCH: 26.3 pg (ref 26.0–34.0)
MCHC: 30.5 g/dL (ref 30.0–36.0)
MCV: 86.2 fL (ref 80.0–100.0)
PLATELETS: 265 10*3/uL (ref 150–400)
RBC: 5.59 MIL/uL (ref 4.22–5.81)
RDW: 13.2 % (ref 11.5–15.5)
WBC: 9.5 10*3/uL (ref 4.0–10.5)
nRBC: 0 % (ref 0.0–0.2)

## 2018-01-29 LAB — GLUCOSE, CAPILLARY
Glucose-Capillary: 238 mg/dL — ABNORMAL HIGH (ref 70–99)
Glucose-Capillary: 266 mg/dL — ABNORMAL HIGH (ref 70–99)
Glucose-Capillary: 287 mg/dL — ABNORMAL HIGH (ref 70–99)
Glucose-Capillary: 296 mg/dL — ABNORMAL HIGH (ref 70–99)

## 2018-01-29 LAB — HEMOGLOBIN A1C
Hgb A1c MFr Bld: 11.2 % — ABNORMAL HIGH (ref 4.8–5.6)
Mean Plasma Glucose: 274.74 mg/dL

## 2018-01-29 LAB — BASIC METABOLIC PANEL
Anion gap: 12 (ref 5–15)
BUN: 12 mg/dL (ref 6–20)
CO2: 25 mmol/L (ref 22–32)
Calcium: 8.8 mg/dL — ABNORMAL LOW (ref 8.9–10.3)
Chloride: 98 mmol/L (ref 98–111)
Creatinine, Ser: 0.92 mg/dL (ref 0.61–1.24)
GFR calc Af Amer: >60 mL/min (ref >60)
GFR calc non Af Amer: >60 mL/min (ref >60)
Glucose, Bld: 297 mg/dL — ABNORMAL HIGH (ref 70–99)
POTASSIUM: 4.7 mmol/L (ref 3.5–5.1)
Sodium: 135 mmol/L (ref 135–145)

## 2018-01-29 LAB — HIV ANTIBODY (ROUTINE TESTING W REFLEX): HIV Screen 4th Generation wRfx: NONREACTIVE

## 2018-01-29 MED ORDER — BACITRACIN-POLYMYXIN B 500-10000 UNIT/GM OP OINT: 1.0000 "application " | TOPICAL_OINTMENT | Freq: Two times a day (BID) | OPHTHALMIC | 0 refills | Status: AC

## 2018-01-29 MED ORDER — ATROPINE SULFATE 1 % OP SOLN: 1.0000 [drp] | Freq: Two times a day (BID) | OPHTHALMIC | 12 refills | Status: AC

## 2018-01-29 MED ORDER — OXYCODONE-ACETAMINOPHEN 5-325 MG PO TABS: 1.0000 | ORAL_TABLET | Freq: Four times a day (QID) | ORAL | 0 refills | Status: AC | PRN

## 2018-01-29 MED ORDER — METFORMIN HCL 1000 MG PO TABS: 1000.0000 mg | ORAL_TABLET | Freq: Two times a day (BID) | ORAL | 0 refills | Status: AC

## 2018-01-29 MED ORDER — PREDNISOLONE ACETATE 1 % OP SUSP: 1.0000 [drp] | Freq: Three times a day (TID) | OPHTHALMIC | 0 refills | Status: AC
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
## 2018-01-29 NOTE — Progress Notes (Signed)
RT unable to get to room 5N 17 at time of CPAP order due to being with critical pt in ICU. RT will let day shift therapist know about the cpap order to relay to night shift therapist.
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
## 2018-01-29 NOTE — Plan of Care (Signed)
Problem: Education: Goal: Knowledge of General Education information will improve Description Including pain rating scale, medication(s)/side effects and non-pharmacologic comfort measures Outcome: Progressing   Problem: Health Behavior/Discharge Planning: Goal: Ability to manage health-related needs will improve Outcome: Progressing   Problem: Activity: Goal: Risk for activity intolerance will decrease Outcome: Progressing   Problem: Nutrition: Goal: Adequate nutrition will be maintained Outcome: Progressing   Problem: Coping: Goal: Level of anxiety will decrease Outcome: Progressing   Problem: Elimination: Goal: Will not experience complications related to urinary retention Outcome: Progressing   Problem: Pain Managment: Goal: General experience of comfort will improve Outcome: Progressing   Problem: Safety: Goal: Ability to remain free from injury will improve Outcome: Progressing   Problem: Skin Integrity: Goal: Risk for impaired skin integrity will decrease Outcome: Progressing   

## 2018-01-29 NOTE — Progress Notes (Addendum)
Inpatient Diabetes Program Recommendations  AACE/ADA: New Consensus Statement on Inpatient Glycemic Control   Target Ranges:  Prepandial:   less than 140 mg/dL      Peak postprandial:   less than 180 mg/dL (1-2 hours)      Critically ill patients:  140 - 180 mg/dL   Results for KAMARION, CICALE (MRN [id number]) as of 01/29/2018 09:12  Ref. Range 01/28/2018 20:02 01/28/2018 21:04 01/28/2018 22:03 01/29/2018 00:16 01/29/2018 06:35  Glucose-Capillary Latest Ref Range: 70 - 99 mg/dL 696 (H) 295 (H) 284 (H) 296 (H) 266 (H)  Results for SHAMARR, MCWHORTER (MRN [id number]) as of 01/29/2018 09:12  Ref. Range 01/29/2018 03:36  Hemoglobin A1C Latest Ref Range: 4.8 - 5.6 % 11.2 (H)   Review of Glycemic Control  Diabetes history: DM2 Outpatient Diabetes medications: None on home med list; but per H&P patient reports taking Metformin and Glipizide Current orders for Inpatient glycemic control: Metformin 1000 mg BID, Novolog 0-15 units TID with meals  Inpatient Diabetes Program Recommendations:  Insulin - Basal: Please consider ordering Lantus 13 units Q24H starting now (based on 130 kg x 0.1 units). Correction (SSI): Please consider ordering Novolog 0-5 units QHS for bedtime correction. HgbA1C: A1C 11.2% on 01/29/18 indicating an average glucose of 275 mg/dl over the past 2-3 months. Per H&P, patient is taking Metformin and Glipizide as an outpatient but neither noted on home medication list. If patient is already taking 2 oral DM medications and A1C 11.2% patient would benefit from taking insulin as an outpatient. However, patient states he would not be willing to take insulin as an outpatient.  01/29/18@15 :10-Spoke with patient about diabetes and home regimen for diabetes control. Patient reports that he is followed by [facility] in [place] for diabetes management and currently he takes Metformin and Glipizide as an outpatient for diabetes control. Patient reports that he is taking DM medications as prescribed. Patient states  that he checks his glucose 2 times per day and that it usually ranges from 100-200's mg/dl and he notes that glucose is typically a little higher in the evening after eating.  Inquired about prior A1C and patient reports that his last A1C value was 7.1% over 1 year ago. Discussed A1C results (11.2% on 01/29/18) and explained that his current A1C indicates an average glucose of 275 mg/dl over the past 2-3 months. Discussed glucose and A1C goals. Discussed importance of checking CBGs and maintaining good CBG control to prevent long-term and short-term complications. Explained how hyperglycemia leads to damage within blood vessels which lead to the common complications seen with uncontrolled diabetes. Stressed to the patient the importance of improving glycemic control to prevent further complications from uncontrolled diabetes especially to promote wound healing and decrease risk of infection. Discussed impact of nutrition, exercise, stress, sickness, and medications on diabetes control. Patient states that he has recently been taking Prednisone (within the last 2-3 months) for skin issues.  Discussed how steroids can increase glucose and is likely contributing to elevated A1C. 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  [facility]. Encouraged patient to check his glucose 3-4 times per day and to keep a log book  of glucose readings which he will need to take to doctor appointments. Explained how the doctor he follows up with can use the log book to continue to make adjustments with DM medications if needed. Patient verbalized understanding of information discussed and he states that he has no further questions  at this time related to diabetes.  Thanks, Orlando Penner, RN, MSN, CDE Diabetes Coordinator Inpatient Diabetes Program [phone] (Team Pager from 8am to 5pm)
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
## 2018-01-29 NOTE — Progress Notes (Signed)
AVS given and reviewed with pt. Medications discussed. All questions answered to satisfaction. Pt escorted off the unit via wheelchair by staff member.  
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
## 2018-02-02 ENCOUNTER — Encounter (INDEPENDENT_AMBULATORY_CARE_PROVIDER_SITE_OTHER): Payer: Self-pay | Admitting: Ophthalmology

## 2018-02-02 ENCOUNTER — Ambulatory Visit (INDEPENDENT_AMBULATORY_CARE_PROVIDER_SITE_OTHER): Payer: No Typology Code available for payment source | Admitting: Ophthalmology

## 2018-02-02 DIAGNOSIS — H2101 Hyphema, right eye: Secondary | ICD-10-CM

## 2018-02-02 DIAGNOSIS — S0591XA Unspecified injury of right eye and orbit, initial encounter: Secondary | ICD-10-CM

## 2018-02-02 DIAGNOSIS — H25813 Combined forms of age-related cataract, bilateral: Secondary | ICD-10-CM

## 2018-02-02 DIAGNOSIS — H31301 Unspecified choroidal hemorrhage, right eye: Secondary | ICD-10-CM

## 2018-02-02 DIAGNOSIS — H3581 Retinal edema: Secondary | ICD-10-CM

## 2018-02-02 DIAGNOSIS — H4311 Vitreous hemorrhage, right eye: Secondary | ICD-10-CM | POA: Diagnosis not present

## 2018-02-02 DIAGNOSIS — S01111S Laceration without foreign body of right eyelid and periocular area, sequela: Secondary | ICD-10-CM

## 2018-02-02 MED ORDER — DORZOLAMIDE HCL-TIMOLOL MAL 2-0.5 % OP SOLN: 1.0000 [drp] | Freq: Two times a day (BID) | OPHTHALMIC | 3 refills | Status: AC

## 2021-06-15 ENCOUNTER — Encounter (HOSPITAL_COMMUNITY): Payer: Self-pay

## 2021-06-15 ENCOUNTER — Other Ambulatory Visit: Payer: Self-pay

## 2021-06-15 ENCOUNTER — Emergency Department (HOSPITAL_COMMUNITY)
Admission: EM | Admit: 2021-06-15 | Discharge: 2021-06-15 | Disposition: A | Discharge status: Home / Self Care | Payer: No Typology Code available for payment source | Attending: Emergency Medicine | Admitting: Emergency Medicine

## 2021-06-15 DIAGNOSIS — Z7984 Long term (current) use of oral hypoglycemic drugs: Secondary | ICD-10-CM | POA: Diagnosis not present

## 2021-06-15 DIAGNOSIS — Z4802 Encounter for removal of sutures: Secondary | ICD-10-CM | POA: Insufficient documentation

## 2021-06-15 DIAGNOSIS — E119 Type 2 diabetes mellitus without complications: Secondary | ICD-10-CM | POA: Insufficient documentation

## 2021-06-15 DIAGNOSIS — K610 Anal abscess: Secondary | ICD-10-CM | POA: Insufficient documentation

## 2021-06-15 DIAGNOSIS — Z5189 Encounter for other specified aftercare: Secondary | ICD-10-CM

## 2021-06-15 LAB — CBC WITH DIFFERENTIAL/PLATELET
Abs Immature Granulocytes: 0.01 10*3/uL (ref 0.00–0.07)
Basophils Absolute: 0.1 10*3/uL (ref 0.0–0.1)
Basophils Relative: 1 %
Eosinophils Absolute: 0.1 10*3/uL (ref 0.0–0.5)
Eosinophils Relative: 2 %
HCT: 42.9 % (ref 39.0–52.0)
Hemoglobin: 13.1 g/dL (ref 13.0–17.0)
Immature Granulocytes: 0 %
Lymphocytes Relative: 40 %
Lymphs Abs: 2.3 10*3/uL (ref 0.7–4.0)
MCH: 26.6 pg (ref 26.0–34.0)
MCHC: 30.5 g/dL (ref 30.0–36.0)
MCV: 87 fL (ref 80.0–100.0)
Monocytes Absolute: 0.7 10*3/uL (ref 0.1–1.0)
Monocytes Relative: 12 %
Neutro Abs: 2.6 10*3/uL (ref 1.7–7.7)
Neutrophils Relative %: 45 %
Platelets: 320 10*3/uL (ref 150–400)
RBC: 4.93 MIL/uL (ref 4.22–5.81)
RDW: 13.1 % (ref 11.5–15.5)
WBC: 5.7 10*3/uL (ref 4.0–10.5)
nRBC: 0 % (ref 0.0–0.2)

## 2021-06-15 LAB — BASIC METABOLIC PANEL
Anion gap: 5 (ref 5–15)
BUN: 8 mg/dL (ref 8–23)
CO2: 25 mmol/L (ref 22–32)
Calcium: 9 mg/dL (ref 8.9–10.3)
Chloride: 105 mmol/L (ref 98–111)
Creatinine, Ser: 0.89 mg/dL (ref 0.61–1.24)
GFR, Estimated: >60 mL/min (ref >60)
Glucose, Bld: 166 mg/dL — ABNORMAL HIGH (ref 70–99)
Potassium: 4.5 mmol/L (ref 3.5–5.1)
Sodium: 135 mmol/L (ref 135–145)

## 2021-06-15 MED ORDER — OXYCODONE-ACETAMINOPHEN 5-325 MG PO TABS
1.0000 | ORAL_TABLET | Freq: Once | ORAL | Status: AC
  Administered 2021-06-15: 1 via ORAL
  Filled 2021-06-15: qty 1

## 2021-06-15 MED ORDER — LIDOCAINE-EPINEPHRINE (PF) 2 %-1:200000 IJ SOLN
20.0000 mL | Freq: Once | INTRAMUSCULAR | Status: AC
  Administered 2021-06-15: 20 mL
  Filled 2021-06-15: qty 20

## 2021-06-15 NOTE — Discharge Instructions (Signed)
You were seen today with concerns for bleeding from your wound.  Your drain had fallen out of the incision and drainage site.  This was removed and the wound was cleaned of blood clot.  It does not appear infected.  Keep gauze dressing over it and change after every bowel movement.  Make sure that you are keeping it clean.  Follow-up with general surgery.

## 2021-06-15 NOTE — ED Triage Notes (Addendum)
Pt reports he had a recent hospital visit and had an abscess on his bottom and it was drained. Pt had packing to the wound. Pt reports the packing fell out and now he would like for it to be repacked.

## 2021-06-15 NOTE — ED Provider Triage Note (Signed)
Emergency Medicine Provider Triage Evaluation Note  James Parker , a 61 y.o. male  was evaluated in triage.  Pt complains of sudden worsening pain and passage of large clump of tissue from perianal wound this evening.  Patient with recurrent perianal/perirectal abscesses.  Taken to the OR at [facility] on 6/2 by Dr. Herma Ard, penrose drain and 1/2 in packing placed.  Patient states that he has been having pain since that time but significantly worsened this evening.  States he fell asleep and he woke up because his bed was soaked in blood.  While walking to the bathroom a large cup of tissue fell from his perianal area with significant subsequent pain.  Endorses chills at home. On doxycycline PO, last dose this evening.  Review of Systems  Positive: Chills, he rectal pain and bleeding Negative: Fevers, nausea, vomiting, diarrhea  Physical Exam  BP 133/85 (BP Location: Right Arm)   Pulse 75   Temp 98.3 F (36.8 C) (Oral)   Resp 18   SpO2 97%  Gen:   Awake, acute distress with significant pain. Resp:  Normal effort  MSK:   Moves extremities without difficulty  Other:  RRR no M/R/G.  Wound visualized by this provider with RN Marylu Lund as chaperone.  Active bleeding from the wound is in the left perianal region with extension of induration towards the perineum.  There is induration around the wound, however no obvious purulence.  Penrose drain has been pulled from the wound but remains sutured to the adjacent skin.  Very poorly tolerated exam by patient secondary to pain.  Exam aborted due to patient's poor tolerance and extensive bleeding.  Medical Decision Making  Medically screening exam initiated at 2:06 AM.  Appropriate orders placed.  James Parker was informed that the remainder of the evaluation will be completed by another provider, this initial triage assessment does not replace that evaluation, and the importance of remaining in the ED until their evaluation is complete.  Patient will  require room in the main ED for further visualization and management of his wound, RN informed charge RN.  This chart was dictated using voice recognition software, Dragon. Despite the best efforts of this provider to proofread and correct errors, errors may still occur which can change documentation meaning.    Paris Lore, PA-C 06/15/21 0214

## 2021-06-15 NOTE — ED Provider Notes (Signed)
[facility] EMERGENCY DEPARTMENT Provider Note   CSN: [id number] Arrival date & time: 06/15/21  0110     History  Chief Complaint  Patient presents with   Wound Check    James Parker is a 61 y.o. male.  HPI     This is a 61 year old male who is presents with concerns for bleeding wound.  Patient was seen and evaluated on 6/2 at [facility].  At that time he had a perianal abscess that was drained in the OR.  He had a drain placed.  He states that tonight he got up to go to the bathroom and noted blood all over his bed.  He noted a huge "glob of stuff" come out."  He is not on any blood thinners.  He is on oral antibiotics which she finished last evening.  He denies any fevers or systemic symptoms.  Home Medications Prior to Admission medications   Medication Sig Start Date End Date Taking? Authorizing Provider  atropine 1 % ophthalmic solution Place 1 drop into the right eye 2 (two) times daily. 01/29/18   Arrien, York Ram, MD  bacitracin-polymyxin b (POLYSPORIN) ophthalmic ointment Place 1 application into the right eye 2 (two) times daily. apply to eye every 12 hours while awake 01/29/18   Arrien, York Ram, MD  metFORMIN (GLUCOPHAGE) 1000 MG tablet Take 1 tablet (1,000 mg total) by mouth 2 (two) times daily with a meal for 30 days. 01/29/18 02/28/18  Arrien, York Ram, MD  oxyCODONE-acetaminophen (PERCOCET/ROXICET) 5-325 MG tablet Take 1 tablet by mouth every 6 (six) hours as needed for moderate pain. 01/29/18   Arrien, York Ram, MD  prednisoLONE acetate (PRED FORTE) 1 % ophthalmic suspension Place 1 drop into the right eye 4 (four) times daily -  before meals and at bedtime. 01/29/18   Arrien, York Ram, MD      Allergies    Ibuprofen    Review of Systems   Review of Systems  Constitutional:  Negative for fever.  Skin:  Positive for wound.  All other systems reviewed and are negative.  Physical Exam Updated Vital Signs BP (!)  138/56 (BP Location: Left Arm)   Pulse (!) 53   Temp 98.3 F (36.8 C) (Oral)   Resp 18   SpO2 93%  Physical Exam Vitals and nursing note reviewed.  Constitutional:      Appearance: He is well-developed. He is obese.     Comments: Morbidly obese  HENT:     Head: Normocephalic and atraumatic.  Eyes:     Pupils: Pupils are equal, round, and reactive to light.  Cardiovascular:     Rate and Rhythm: Normal rate and regular rhythm.  Pulmonary:     Effort: Pulmonary effort is normal. No respiratory distress.  Abdominal:     Palpations: Abdomen is soft.     Tenderness: There is no abdominal tenderness.  Genitourinary:      Comments: Examination of perianal with clot noted at the incision and drainage site.  Penrose drain has been dislodged from the incision and is just being held by a suture.  There is no active bleeding.  No packing material noted. Musculoskeletal:     Cervical back: Neck supple.  Skin:    General: Skin is warm and dry.  Neurological:     Mental Status: He is alert and oriented to person, place, and time.    ED Results / Procedures / Treatments   Labs (all labs ordered are  listed, but only abnormal results are displayed) Labs Reviewed  BASIC METABOLIC PANEL - Abnormal; Notable for the following components:      Result Value   Glucose, Bld 166 (*)    All other components within normal limits  CBC WITH DIFFERENTIAL/PLATELET    EKG None  Radiology No results found.  Procedures .Suture Removal  Date/Time: 06/15/2021 5:17 AM Performed by: Shon Baton, MD Authorized by: Shon Baton, MD   Consent:    Consent obtained:  Verbal   Consent given by:  Patient   Risks discussed:  Bleeding and pain Location:    Location:  Anogenital   Anogenital location:  Perianal Procedure details:    Wound appearance:  No signs of infection, nontender and clean   Number of sutures removed:  1 Post-procedure details:    Procedure completion:   Tolerated Comments:     Suture was cut from Penrose drain and drain was removed, clot was evacuated from the wound and wound was irrigated with normal saline.  There is no packing material present.  Wound is without erythema or drainage.    Medications Ordered in ED Medications  lidocaine-EPINEPHrine (XYLOCAINE W/EPI) 2 %-1:200000 (PF) injection 20 mL (has no administration in time range)  oxyCODONE-acetaminophen (PERCOCET/ROXICET) 5-325 MG per tablet 1 tablet (1 tablet Oral Given 06/15/21 0220)  oxyCODONE-acetaminophen (PERCOCET/ROXICET) 5-325 MG per tablet 1 tablet (1 tablet Oral Given 06/15/21 0456)    ED Course/ Medical Decision Making/ A&P                           Medical Decision Making Risk Prescription drug management.   This patient presents to the ED for concern of drainage and clot from wound, this involves an extensive number of treatment options, and is a complaint that carries with it a high risk of complications and morbidity.  I considered the following differential and admission for this acute, potentially life threatening condition.  The differential diagnosis includes infection, bleeding, dislodgment of drain, dislodgment of packing material  MDM:    Patient presents with concerns for bleeding and a clump of stuff coming from his perianal wound.  He is nontoxic and vital signs are largely reassuring.  I highly suspect that the debris from the wound was likely the packing material.  He has clot at the incision and drainage site.  I did numb him up and remove the primary strain by removing the suture.  Clot was removed and wound was cleaned.  It does not appear infected.  There is no active bleeding.  We discussed ongoing wound care and follow-up with general surgery.  Patient stated understanding.  Lab work was sent from triage and is reassuring including hemoglobin and white count.  (Labs, imaging, consults)  Labs: I Ordered, and personally interpreted labs.  The  pertinent results include: CBC, BMP  Imaging Studies ordered: I ordered imaging studies including none I independently visualized and interpreted imaging. I agree with the radiologist interpretation  Additional history obtained from chart review.  External records from outside source obtained and reviewed including recent OR notes  Cardiac Monitoring: The patient was maintained on a cardiac monitor.  I personally viewed and interpreted the cardiac monitored which showed an underlying rhythm of: Sinus rhythm  Reevaluation: After the interventions noted above, I reevaluated the patient and found that they have :improved  Social Determinants of Health: Lives independent  Disposition: Discharge  Co morbidities that complicate the patient evaluation  Past Medical History:  Diagnosis Date   Diabetes mellitus    Internal hemorrhoids    Obesity      Medicines Meds ordered this encounter  Medications   oxyCODONE-acetaminophen (PERCOCET/ROXICET) 5-325 MG per tablet 1 tablet   lidocaine-EPINEPHrine (XYLOCAINE W/EPI) 2 %-1:200000 (PF) injection 20 mL   oxyCODONE-acetaminophen (PERCOCET/ROXICET) 5-325 MG per tablet 1 tablet    I have reviewed the patients home medicines and have made adjustments as needed  Problem List / ED Course: Problem List Items Addressed This Visit   None Visit Diagnoses     Visit for wound check    -  Primary                   Final Clinical Impression(s) / ED Diagnoses Final diagnoses:  Visit for wound check    Rx / DC Orders ED Discharge Orders     None         Shon BatonHorton, Emelia Sandoval F, MD 06/15/21 [phone]
